# Patient Record
Sex: Male | Born: 1965 | Race: White | Hispanic: No | Marital: Married | Smoking: Never smoker
Health system: Southern US, Community
[De-identification: ages and names within clinical notes are randomized; demographics above are authoritative.]

## PROBLEM LIST (undated history)

## (undated) DIAGNOSIS — R51 Headache: Secondary | ICD-10-CM

## (undated) DIAGNOSIS — N200 Calculus of kidney: Secondary | ICD-10-CM

## (undated) DIAGNOSIS — R112 Nausea with vomiting, unspecified: Secondary | ICD-10-CM

## (undated) DIAGNOSIS — R519 Headache, unspecified: Secondary | ICD-10-CM

## (undated) DIAGNOSIS — Z9889 Other specified postprocedural states: Secondary | ICD-10-CM

## (undated) HISTORY — PX: VARICOCELECTOMY: SHX1084

---

## 2017-02-09 DIAGNOSIS — Z87442 Personal history of urinary calculi: Secondary | ICD-10-CM | POA: Insufficient documentation

## 2017-02-09 DIAGNOSIS — Z8669 Personal history of other diseases of the nervous system and sense organs: Secondary | ICD-10-CM | POA: Insufficient documentation

## 2017-02-09 DIAGNOSIS — Z833 Family history of diabetes mellitus: Secondary | ICD-10-CM | POA: Insufficient documentation

## 2018-07-12 ENCOUNTER — Ambulatory Visit (INDEPENDENT_AMBULATORY_CARE_PROVIDER_SITE_OTHER): Payer: BLUE CROSS/BLUE SHIELD

## 2018-07-12 ENCOUNTER — Encounter: Payer: Self-pay | Admitting: Emergency Medicine

## 2018-07-12 ENCOUNTER — Ambulatory Visit
Admission: EM | Admit: 2018-07-12 | Discharge: 2018-07-12 | Disposition: A | Discharge status: Home / Self Care | Payer: BLUE CROSS/BLUE SHIELD | Attending: Family Medicine | Admitting: Family Medicine

## 2018-07-12 DIAGNOSIS — M545 Low back pain: Secondary | ICD-10-CM | POA: Diagnosis not present

## 2018-07-12 DIAGNOSIS — R1030 Lower abdominal pain, unspecified: Secondary | ICD-10-CM | POA: Diagnosis not present

## 2018-07-12 DIAGNOSIS — N202 Calculus of kidney with calculus of ureter: Secondary | ICD-10-CM | POA: Diagnosis not present

## 2018-07-12 DIAGNOSIS — N200 Calculus of kidney: Secondary | ICD-10-CM

## 2018-07-12 HISTORY — DX: Calculus of kidney: N20.0

## 2018-07-12 LAB — URINALYSIS, COMPLETE (UACMP) WITH MICROSCOPIC
Bacteria, UA: NONE SEEN
Bilirubin Urine: NEGATIVE
Glucose, UA: NEGATIVE mg/dL
Ketones, ur: 40 mg/dL — AB
Leukocytes, UA: NEGATIVE
Nitrite: NEGATIVE
Protein, ur: 30 mg/dL — AB
RBC / HPF: >50 RBC/hpf (ref 0–5)
Specific Gravity, Urine: 1.025 (ref 1.005–1.030)
Squamous Epithelial / LPF: NONE SEEN (ref 0–5)
WBC, UA: NONE SEEN WBC/hpf (ref 0–5)
pH: 6 (ref 5.0–8.0)

## 2018-07-12 MED ORDER — ONDANSETRON 8 MG PO TBDP: 8.0000 mg | ORAL_TABLET | Freq: Two times a day (BID) | ORAL | 0 refills | Status: DC

## 2018-07-12 MED ORDER — ONDANSETRON 8 MG PO TBDP
8.0000 mg | ORAL_TABLET | Freq: Once | ORAL | Status: AC
  Administered 2018-07-12: 8 mg via ORAL

## 2018-07-12 MED ORDER — HYDROCODONE-ACETAMINOPHEN 5-325 MG PO TABS: 1.0000 | ORAL_TABLET | Freq: Four times a day (QID) | ORAL | 0 refills | Status: DC | PRN

## 2018-07-12 MED ORDER — TAMSULOSIN HCL 0.4 MG PO CAPS: 0.4000 mg | ORAL_CAPSULE | Freq: Every morning | ORAL | 0 refills | Status: AC

## 2018-07-12 MED ORDER — KETOROLAC TROMETHAMINE 60 MG/2ML IM SOLN
60.0000 mg | Freq: Once | INTRAMUSCULAR | Status: AC
  Administered 2018-07-12: 60 mg via INTRAMUSCULAR

## 2018-07-12 NOTE — Discharge Instructions (Signed)
Drink plenty of fluids.  Strain all of your urine to capture any stone.  If you worsen go to the emergency room.  Arrange a follow-up with urology next week

## 2018-07-12 NOTE — ED Provider Notes (Signed)
[facility]    CSN: [id number] Arrival date & time: 07/12/18  1538     History   Chief Complaint Chief Complaint  Patient presents with  . Abdominal Pain    HPI Roger Russell is a 52 y.o. male.   HPI  52 year old male presents with his wife complaining of pain on the left lower back with radiation into his left groin.  Dates that the onset was sudden.  Nausea but no vomiting.  He has had one episode of a kidney stone about a year and a half ago.  Recently about a week and a half ago was treated for low back pain from "disks" This pain he states is different.  Does not radiate into his leg.  Very uncomfortable and when I walked in the room he was bent over the examining table.        Past Medical History:  Diagnosis Date  . Kidney stone     There are no active problems to display for this patient.   Past Surgical History:  Procedure Laterality Date  . VARICOCELECTOMY         Home Medications    Prior to Admission medications   Medication Sig Start Date End Date Taking? Authorizing Provider  HYDROcodone-acetaminophen (NORCO/VICODIN) 5-325 MG tablet Take 1-2 tablets by mouth every 6 (six) hours as needed for severe pain. 07/12/18   Lutricia Feil, PA-C  ondansetron (ZOFRAN ODT) 8 MG disintegrating tablet Take 1 tablet (8 mg total) by mouth 2 (two) times daily. 07/12/18   Lutricia Feil, PA-C  tamsulosin (FLOMAX) 0.4 MG CAPS capsule Take 1 capsule (0.4 mg total) by mouth every morning. 07/12/18   Lutricia Feil, PA-C    Family History Family History  Problem Relation Age of Onset  . Hypertension Mother   . Diabetes Mother   . Diabetes Father   . Hypertension Father     Social History Social History   Tobacco Use  . Smoking status: Never Smoker  . Smokeless tobacco: Never Used  Substance Use Topics  . Alcohol use: Yes    Comment: socially  . Drug use: Never     Allergies   Patient has no known allergies.   Review of  Systems Review of Systems  Constitutional: Positive for activity change. Negative for appetite change, chills, fatigue and fever.  Gastrointestinal: Positive for nausea.  All other systems reviewed and are negative.    Physical Exam Triage Vital Signs ED Triage Vitals  Enc Vitals Group     BP 07/12/18 1551 (!) 200/102     Pulse Rate 07/12/18 1551 76     Resp 07/12/18 1551 20     Temp 07/12/18 1551 97.6 F (36.4 C)     Temp Source 07/12/18 1551 Oral     SpO2 07/12/18 1551 100 %     Weight 07/12/18 1549 180 lb (81.6 kg)     Height 07/12/18 1549 6\' 2"  (1.88 m)     Head Circumference --      Peak Flow --      Pain Score 07/12/18 1549 9     Pain Loc --      Pain Edu? --      Excl. in GC? --    No data found.  Updated Vital Signs BP (!) 200/102 (BP Location: Left Arm)   Pulse 76   Temp 97.6 F (36.4 C) (Oral)   Resp 20   Ht 6\' 2"  (1.88 m)  Wt 180 lb (81.6 kg)   SpO2 100%   BMI 23.11 kg/m   Visual Acuity Right Eye Distance:   Left Eye Distance:   Bilateral Distance:    Right Eye Near:   Left Eye Near:    Bilateral Near:     Physical Exam  Constitutional: He is oriented to person, place, and time. He appears well-developed and well-nourished.  Non-toxic appearance. He does not appear ill. No distress.  HENT:  Head: Normocephalic.  Eyes: Pupils are equal, round, and reactive to light.  Pulmonary/Chest: Effort normal and breath sounds normal.  Abdominal: There is CVA tenderness.  Neurological: He is alert and oriented to person, place, and time.  Skin: Skin is warm and dry.  Psychiatric: He has a normal mood and affect. His behavior is normal.  Nursing note and vitals reviewed.    UC Treatments / Results  Labs (all labs ordered are listed, but only abnormal results are displayed) Labs Reviewed  URINALYSIS, COMPLETE (UACMP) WITH MICROSCOPIC - Abnormal; Notable for the following components:      Result Value   APPearance HAZY (*)    Hgb urine dipstick  LARGE (*)    Ketones, ur 40 (*)    Protein, ur 30 (*)    All other components within normal limits    EKG None  Radiology Ct Renal Stone Study  Result Date: 07/12/2018 CLINICAL DATA:  Left flank pain since noon today.  Hematuria. EXAM: CT ABDOMEN AND PELVIS WITHOUT CONTRAST TECHNIQUE: Multidetector CT imaging of the abdomen and pelvis was performed following the standard protocol without IV contrast. COMPARISON:  None. FINDINGS: Lower chest: Calcified granuloma at the left lung base. Hepatobiliary: Small calcified granuloma in the right lobe of the liver. Probable sludge in the gallbladder. Pancreas: Unremarkable. No pancreatic ductal dilatation or surrounding inflammatory changes. Spleen: Normal in size, containing a calcified granuloma. Adrenals/Urinary Tract: 5 mm transverse diameter calculus in the proximal left ureter, just below the ureteropelvic junction. This measures 9 mm in length. Associated mild to moderate dilatation of the left renal collecting system and mild left perinephric soft tissue stranding. There is also a 4 mm calculus at the left ureterovesical junction with mild dilatation of the left ureter. Several small mid and lower pole right renal calculi. The largest measures 5 mm in maximum diameter. No right ureteral calculi. Normal appearing adrenal glands. Stomach/Bowel: Distended stomach containing fluid and gas. The proximal 2nd portion of the duodenum is also distended with fluid. No obstructing mass seen. The remainder of the small bowel is normal in caliber, as is the colon. Normal appearing appendix. Vascular/Lymphatic: Minimal atheromatous aortic calcification. No enlarged lymph nodes. Reproductive: Moderately enlarged prostate gland and seminal vesicles. Other: Tiny umbilical hernia containing fat small left inguinal hernia containing fat. Musculoskeletal: Lower thoracic and minimal lumbar spine degenerative changes. Mild scoliosis. IMPRESSION: 1. 9 mm proximal left ureteral  calculus causing mild to moderate left hydronephrosis. 2. 4 mm distal left ureteral calculus at the ureterovesical junction, causing mild dilatation of the left ureter. 3. Small, nonobstructing right renal calculi. 4. Distended stomach and proximal duodenum, most likely due to reactive ileus. Electronically Signed   By: Beckie SaltsSteven  Reid M.D.   On: 07/12/2018 17:36    Procedures Procedures (including critical care time)  Medications Ordered in UC Medications  ketorolac (TORADOL) injection 60 mg (60 mg Intramuscular Given 07/12/18 1559)  ondansetron (ZOFRAN-ODT) disintegrating tablet 8 mg (8 mg Oral Given 07/12/18 1639)    Initial Impression / Assessment and Plan /  UC Course  I have reviewed the triage vital signs and the nursing notes.  Pertinent labs & imaging results that were available during my care of the patient were reviewed by me and considered in my medical decision making (see chart for details).     Plan: 1. Test/x-ray results and diagnosis reviewed with patient 2. rx as per orders; risks, benefits, potential side effects reviewed with patient 3. Recommend supportive treatment with forced fluids.  Make appointment with urology next week.  Check your in the network of physicians.  You worsen go to the emergency room 4. F/u prn if symptoms worsen or don't improve   Final Clinical Impressions(s) / UC Diagnoses   Final diagnoses:  Kidney stone on left side     Discharge Instructions     Drink plenty of fluids.  Strain all of your urine to capture any stone.  If you worsen go to the emergency room.  Arrange a follow-up with urology next week    ED Prescriptions    Medication Sig Dispense Auth. Provider   ondansetron (ZOFRAN ODT) 8 MG disintegrating tablet Take 1 tablet (8 mg total) by mouth 2 (two) times daily. 6 tablet Ovid Curd P, PA-C   tamsulosin (FLOMAX) 0.4 MG CAPS capsule Take 1 capsule (0.4 mg total) by mouth every morning. 30 capsule Lutricia Feil, PA-C    HYDROcodone-acetaminophen (NORCO/VICODIN) 5-325 MG tablet  (Status: Discontinued) Take 1-2 tablets by mouth every 6 (six) hours as needed for severe pain. 20 tablet Lutricia Feil, PA-C   HYDROcodone-acetaminophen (NORCO/VICODIN) 5-325 MG tablet Take 1-2 tablets by mouth every 6 (six) hours as needed for severe pain. 20 tablet Lutricia Feil, PA-C     Controlled Substance Prescriptions [place] Controlled Substance Registry consulted? Not Applicable   Lutricia Feil, PA-C 07/12/18 1816

## 2018-07-12 NOTE — ED Triage Notes (Signed)
Patient here with c/o abdominal pain that radiates into the left groin area that started 3 hours ago. Reports nausea, denies vomiting.

## 2018-07-15 ENCOUNTER — Encounter: Payer: Self-pay | Admitting: Urology

## 2018-07-15 ENCOUNTER — Other Ambulatory Visit: Payer: Self-pay | Admitting: Radiology

## 2018-07-15 ENCOUNTER — Ambulatory Visit: Payer: BLUE CROSS/BLUE SHIELD | Admitting: Urology

## 2018-07-15 VITALS — BP 170/90 | HR 85 | Ht 74.0 in | Wt 180.0 lb

## 2018-07-15 DIAGNOSIS — N200 Calculus of kidney: Secondary | ICD-10-CM

## 2018-07-15 DIAGNOSIS — N201 Calculus of ureter: Secondary | ICD-10-CM

## 2018-07-15 DIAGNOSIS — N132 Hydronephrosis with renal and ureteral calculous obstruction: Secondary | ICD-10-CM

## 2018-07-15 DIAGNOSIS — R3129 Other microscopic hematuria: Secondary | ICD-10-CM | POA: Diagnosis not present

## 2018-07-15 LAB — MICROSCOPIC EXAMINATION
BACTERIA UA: NONE SEEN
Epithelial Cells (non renal): NONE SEEN /hpf (ref 0–10)
WBC UA: NONE SEEN /HPF (ref 0–5)

## 2018-07-15 LAB — URINALYSIS, COMPLETE
Bilirubin, UA: NEGATIVE
GLUCOSE, UA: NEGATIVE
Ketones, UA: NEGATIVE
Leukocytes, UA: NEGATIVE
Nitrite, UA: NEGATIVE
PH UA: 5.5 (ref 5.0–7.5)
PROTEIN UA: NEGATIVE
Specific Gravity, UA: 1.01 (ref 1.005–1.030)
UUROB: 0.2 mg/dL (ref 0.2–1.0)

## 2018-07-15 MED ORDER — OXYCODONE-ACETAMINOPHEN 10-325 MG PO TABS: 1.0000 | ORAL_TABLET | ORAL | 0 refills | Status: DC | PRN

## 2018-07-15 MED ORDER — ONDANSETRON 8 MG PO TBDP: 8.0000 mg | ORAL_TABLET | Freq: Two times a day (BID) | ORAL | 0 refills | Status: DC

## 2018-07-15 NOTE — H&P (View-Only) (Signed)
07/15/2018 2:05 PM   Roger Russell [birth year] [id number]  Referring provider: Marygrace Drought, MD [address] [facility] [place], [place] [address]  Chief Complaint  Patient presents with  . Nephrolithiasis    HPI: Patient is a 52 year old Caucasian male who was referred by  Dr. Thersa Salt for nephrolithiasis with his wife, Marzetta Board.   He was seen at [facility] urgent care on 07/12/2018 for pain in his left lower back radiating to his left groin associated with nausea.  CT Renal stone study on 07/12/2018 noted a 9 mm proximal left ureteral calculus causing mild to moderate left hydronephrosis.  4 mm distal left ureteral calculus at the ureterovesical junction, causing mild dilatation of the left ureter.   Small, nonobstructing right renal calculi.  Distended stomach and proximal duodenum, most likely due to reactive ileus.  Moderately enlarged prostate gland and seminal vesicles.    He was discharged with Vicodin 5/325, Zofran 8 mg ODT and tamsulosin 0.4 mg.     Labs in the ED:  UA on 07/12/2018 noted > 50 RBC's.     Meds given in the ED: Toradol 60 mg IM and Zofran 8 mg ODT  Prior urological history:  He has a prior stone history.     Current NSAID/anticoagulation:   None   Today: he states the pain is located in the left flank to the left groin.  It was 8/10 last night. Now the pain is 3/10.  He is having associated intermittency, hesitancy and a weak stream.  He is having chills when the pain is intense.  He is having nausea.  Patient denies any gross hematuria or dysuria.   Patient denies any fevers or vomiting.   He has been passing fragments since the Apr 11, 2023.  He has passed a 6 mm x 38m stone on the 21st.    PMH: Past Medical History:  Diagnosis Date  . Kidney stone     Surgical History: Past Surgical History:  Procedure Laterality Date  . VARICOCELECTOMY      Home Medications:  Allergies as of 07/15/2018   No Known Allergies     Medication List          Accurate as of 07/15/18  2:05 PM. Always use your most recent med list.          HYDROcodone-acetaminophen 5-325 MG tablet Commonly known as:  NORCO/VICODIN Take 1-2 tablets by mouth every 6 (six) hours as needed for severe pain.   ondansetron 8 MG disintegrating tablet Commonly known as:  ZOFRAN ODT Take 1 tablet (8 mg total) by mouth 2 (two) times daily.   oxyCODONE-acetaminophen 10-325 MG tablet Commonly known as:  PERCOCET Take 1 tablet by mouth every 4 (four) hours as needed for pain.   tamsulosin 0.4 MG Caps capsule Commonly known as:  FLOMAX Take 1 capsule (0.4 mg total) by mouth every morning.       Allergies: No Known Allergies  Family History: Family History  Problem Relation Age of Onset  . Hypertension Mother   . Diabetes Mother   . Diabetes Father   . Hypertension Father     Social History:  reports that he has never smoked. He has never used smokeless tobacco. He reports that he drinks alcohol. He reports that he does not use drugs.  ROS: UROLOGY Frequent Urination?: No Hard to postpone urination?: No Burning/pain with urination?: No Get up at night to urinate?: No Leakage of urine?: No Urine stream starts and stops?:  Yes Trouble starting stream?: Yes Do you have to strain to urinate?: No Blood in urine?: No Urinary tract infection?: No Sexually transmitted disease?: No Injury to kidneys or bladder?: No Painful intercourse?: No Weak stream?: Yes Erection problems?: No Penile pain?: No  Gastrointestinal Nausea?: Yes Vomiting?: No Indigestion/heartburn?: No Diarrhea?: No Constipation?: Yes  Constitutional Fever: No Night sweats?: Yes Weight loss?: No Fatigue?: Yes  Skin Skin rash/lesions?: No Itching?: No  Eyes Blurred vision?: No Double vision?: No  Ears/Nose/Throat Sore throat?: No Sinus problems?: No  Hematologic/Lymphatic Swollen glands?: No Easy bruising?: No  Cardiovascular Leg swelling?: No Chest pain?:  No  Respiratory Cough?: No Shortness of breath?: No  Endocrine Excessive thirst?: No  Musculoskeletal Back pain?: Yes Joint pain?: No  Neurological Headaches?: No Dizziness?: No  Psychologic Depression?: No Anxiety?: No  Physical Exam: BP (!) 170/90   Pulse 85   Ht _0  (1.88 m)   Wt 180 lb (81.6 kg)   BMI 23.11 kg/m   Constitutional:  Well nourished. Alert and oriented, No acute distress. HEENT: [place] AT, moist mucus membranes.  Trachea midline, no masses. Cardiovascular: No clubbing, cyanosis, or edema. Respiratory: Normal respiratory effort, no increased work of breathing. GI: Abdomen is soft, non tender, non distended, no abdominal masses. Liver and spleen not palpable.  No hernias appreciated.  Stool sample for occult testing is not indicated.   GU: Left CVA tenderness.  No bladder fullness or masses.   Skin: No rashes, bruises or suspicious lesions. Lymph: No cervical or inguinal adenopathy. Neurologic: Grossly intact, no focal deficits, moving all 4 extremities. Psychiatric: Normal mood and affect.  Laboratory Data: No results found for: WBC, HGB, HCT, MCV, PLT  No results found for: CREATININE  No results found for: PSA  No results found for: TESTOSTERONE  No results found for: HGBA1C  No results found for: TSH  No results found for: CHOL, HDL, CHOLHDL, VLDL, LDLCALC  No results found for: AST No results found for: ALT No components found for: ALKALINEPHOPHATASE No components found for: BILIRUBINTOTAL  No results found for: ESTRADIOL  Urinalysis See HPI and Epic  I have reviewed the labs.   Pertinent Imaging: CLINICAL DATA:  Left flank pain since noon today.  Hematuria.  EXAM: CT ABDOMEN AND PELVIS WITHOUT CONTRAST  TECHNIQUE: Multidetector CT imaging of the abdomen and pelvis was performed following the standard protocol without IV contrast.  COMPARISON:  None.  FINDINGS: Lower chest: Calcified granuloma at the left lung  base.  Hepatobiliary: Small calcified granuloma in the right lobe of the liver. Probable sludge in the gallbladder.  Pancreas: Unremarkable. No pancreatic ductal dilatation or surrounding inflammatory changes.  Spleen: Normal in size, containing a calcified granuloma.  Adrenals/Urinary Tract: 5 mm transverse diameter calculus in the proximal left ureter, just below the ureteropelvic junction. This measures 9 mm in length. Associated mild to moderate dilatation of the left renal collecting system and mild left perinephric soft tissue stranding.  There is also a 4 mm calculus at the left ureterovesical junction with mild dilatation of the left ureter.  Several small mid and lower pole right renal calculi. The largest measures 5 mm in maximum diameter. No right ureteral calculi.  Normal appearing adrenal glands.  Stomach/Bowel: Distended stomach containing fluid and gas. The proximal 2nd portion of the duodenum is also distended with fluid. No obstructing mass seen. The remainder of the small bowel is normal in caliber, as is the colon. Normal appearing appendix.  Vascular/Lymphatic: Minimal atheromatous aortic calcification.  No enlarged lymph nodes.  Reproductive: Moderately enlarged prostate gland and seminal vesicles.  Other: Tiny umbilical hernia containing fat small left inguinal hernia containing fat.  Musculoskeletal: Lower thoracic and minimal lumbar spine degenerative changes. Mild scoliosis.  IMPRESSION: 1. 9 mm proximal left ureteral calculus causing mild to moderate left hydronephrosis. 2. 4 mm distal left ureteral calculus at the ureterovesical junction, causing mild dilatation of the left ureter. 3. Small, nonobstructing right renal calculi. 4. Distended stomach and proximal duodenum, most likely due to reactive ileus.   Electronically Signed   By: Claudie Revering M.D.   On: 07/12/2018 17:36  I have independently reviewed the films.     Assessment & Plan:    1. Left ureteral stone Stone fragments sent for analysis Explained to the patient that AUA Guidelines for patients with uncomplicated ureteral stones ?10 mm should be offered observation, and those with distal stones of similar size should be offered MET with ?-blockers - he wants to undergo more definitive treatment at this time Explained that URS would be the first lined therapy if stone(s) do not pass, but ESWL is the procedure with the least morbidity and lowest complication rate, but URS has a greater stone-free rate in a single procedure He would like to have left ESWL I explained that ESWL is a means of pulverizing urinary stones without surgery using shockwave therapy I discussed the risks involved with ESWL consist of bruising to the skin and kidney region as a result of the shockwave, possibility of long-term kidney damage, development of high blood pressure and damage to the bowel or long, hematuria, urinary bleeding serious enough to require transfusion or surgical repair or removal of the kidney is rare, rare chance of hematoma formation in the kidney and injuries to the spleen, liver or pancreas.  There is also the risk of urinary tract infection or any infection of the blood system or tissue.   There is also a possibility of resultant damage to male organs. I explained that sometimes the stone fragments can stack up in the ureter like coins, a phenomenon described as "Steinstrasse", that would result in a stent placement and/or URS for further treatment I informed the patient that IV sedation is typically used on the truck, but it some rare instances we need to use general anesthesia - the risks being infection, irregular heart beat, irregular BP, stroke, MI, CVA, paralysis, coma and/or death. Advised to contact our office or seek treatment in the ED if becomes febrile or pain/ vomiting are difficult control in order to arrange for emergent/urgent  intervention Script for Percocet 10/325 given # 10, advised not to take more than directed as it has an addictive potential and a possibility of respiratory depression, he should not drive taking this medication or make important decisions while on this medication Patient last received script for Vicodin on 07/12/2018 Refill given for Zofran 8 mg ODT   2. Left hydronephrosis )btain RUS to ensure the hydronephrosis has resolved once they have passed and/or recovered from procedure to ensure to iatrogenic hydronephrosis remains - it is explained to the patient that it is important to document resolution of the hydronephrosis as "silent hydronephrosis" can occur and cause damage and/or loss of the kidney  3. Microscopic hematuria UA today demonstrates 11-30 RBC's continue to monitor the patient's UA after the treatment/passage of the stone to ensure the hematuria has resolved if hematuria persists, we will pursue a hematuria workup with CT Urogram and cystoscopy if appropriate.  4.  Right renal stones Will continue to monitor at this time Will need at least yearly KUB's Advised to contact our office or seek treatment in the ED if becomes febrile or pain/ vomiting are difficult control in order to arrange for emergent/urgent intervention  Return for left ESWL.  These notes generated with voice recognition software. I apologize for typographical errors.  Zara Council, PA-C  [facility] [address]  [place], [place] [address] [phone]

## 2018-07-15 NOTE — Progress Notes (Signed)
07/15/2018 2:05 PM   Sandre Kitty 12/20/1966 [id number]  Referring provider: Marygrace Drought, MD [address] [facility] [place], [place] [address]  Chief Complaint  Patient presents with  . Nephrolithiasis    HPI: Patient is a 52 year old Caucasian male who was referred by  Dr. Thersa Salt for nephrolithiasis with his wife, Marzetta Board.   He was seen at [facility] urgent care on 07/12/2018 for pain in his left lower back radiating to his left groin associated with nausea.  CT Renal stone study on 07/12/2018 noted a 9 mm proximal left ureteral calculus causing mild to moderate left hydronephrosis.  4 mm distal left ureteral calculus at the ureterovesical junction, causing mild dilatation of the left ureter.   Small, nonobstructing right renal calculi.  Distended stomach and proximal duodenum, most likely due to reactive ileus.  Moderately enlarged prostate gland and seminal vesicles.    He was discharged with Vicodin 5/325, Zofran 8 mg ODT and tamsulosin 0.4 mg.     Labs in the ED:  UA on 07/12/2018 noted > 50 RBC's.     Meds given in the ED: Toradol 60 mg IM and Zofran 8 mg ODT  Prior urological history:  He has a prior stone history.     Current NSAID/anticoagulation:   None   Today: he states the pain is located in the left flank to the left groin.  It was 8/10 last night. Now the pain is 3/10.  He is having associated intermittency, hesitancy and a weak stream.  He is having chills when the pain is intense.  He is having nausea.  Patient denies any gross hematuria or dysuria.   Patient denies any fevers or vomiting.   He has been passing fragments since the 03/22/2023.  He has passed a 6 mm x 68m stone on the 21st.    PMH: Past Medical History:  Diagnosis Date  . Kidney stone     Surgical History: Past Surgical History:  Procedure Laterality Date  . VARICOCELECTOMY      Home Medications:  Allergies as of 07/15/2018   No Known Allergies     Medication List          Accurate as of 07/15/18  2:05 PM. Always use your most recent med list.          HYDROcodone-acetaminophen 5-325 MG tablet Commonly known as:  NORCO/VICODIN Take 1-2 tablets by mouth every 6 (six) hours as needed for severe pain.   ondansetron 8 MG disintegrating tablet Commonly known as:  ZOFRAN ODT Take 1 tablet (8 mg total) by mouth 2 (two) times daily.   oxyCODONE-acetaminophen 10-325 MG tablet Commonly known as:  PERCOCET Take 1 tablet by mouth every 4 (four) hours as needed for pain.   tamsulosin 0.4 MG Caps capsule Commonly known as:  FLOMAX Take 1 capsule (0.4 mg total) by mouth every morning.       Allergies: No Known Allergies  Family History: Family History  Problem Relation Age of Onset  . Hypertension Mother   . Diabetes Mother   . Diabetes Father   . Hypertension Father     Social History:  reports that he has never smoked. He has never used smokeless tobacco. He reports that he drinks alcohol. He reports that he does not use drugs.  ROS: UROLOGY Frequent Urination?: No Hard to postpone urination?: No Burning/pain with urination?: No Get up at night to urinate?: No Leakage of urine?: No Urine stream starts and stops?:  Yes Trouble starting stream?: Yes Do you have to strain to urinate?: No Blood in urine?: No Urinary tract infection?: No Sexually transmitted disease?: No Injury to kidneys or bladder?: No Painful intercourse?: No Weak stream?: Yes Erection problems?: No Penile pain?: No  Gastrointestinal Nausea?: Yes Vomiting?: No Indigestion/heartburn?: No Diarrhea?: No Constipation?: Yes  Constitutional Fever: No Night sweats?: Yes Weight loss?: No Fatigue?: Yes  Skin Skin rash/lesions?: No Itching?: No  Eyes Blurred vision?: No Double vision?: No  Ears/Nose/Throat Sore throat?: No Sinus problems?: No  Hematologic/Lymphatic Swollen glands?: No Easy bruising?: No  Cardiovascular Leg swelling?: No Chest pain?:  No  Respiratory Cough?: No Shortness of breath?: No  Endocrine Excessive thirst?: No  Musculoskeletal Back pain?: Yes Joint pain?: No  Neurological Headaches?: No Dizziness?: No  Psychologic Depression?: No Anxiety?: No  Physical Exam: BP (!) 170/90   Pulse 85   Ht _0  (1.88 m)   Wt 180 lb (81.6 kg)   BMI 23.11 kg/m   Constitutional:  Well nourished. Alert and oriented, No acute distress. HEENT: [place] AT, moist mucus membranes.  Trachea midline, no masses. Cardiovascular: No clubbing, cyanosis, or edema. Respiratory: Normal respiratory effort, no increased work of breathing. GI: Abdomen is soft, non tender, non distended, no abdominal masses. Liver and spleen not palpable.  No hernias appreciated.  Stool sample for occult testing is not indicated.   GU: Left CVA tenderness.  No bladder fullness or masses.   Skin: No rashes, bruises or suspicious lesions. Lymph: No cervical or inguinal adenopathy. Neurologic: Grossly intact, no focal deficits, moving all 4 extremities. Psychiatric: Normal mood and affect.  Laboratory Data: No results found for: WBC, HGB, HCT, MCV, PLT  No results found for: CREATININE  No results found for: PSA  No results found for: TESTOSTERONE  No results found for: HGBA1C  No results found for: TSH  No results found for: CHOL, HDL, CHOLHDL, VLDL, LDLCALC  No results found for: AST No results found for: ALT No components found for: ALKALINEPHOPHATASE No components found for: BILIRUBINTOTAL  No results found for: ESTRADIOL  Urinalysis See HPI and Epic  I have reviewed the labs.   Pertinent Imaging: CLINICAL DATA:  Left flank pain since noon today.  Hematuria.  EXAM: CT ABDOMEN AND PELVIS WITHOUT CONTRAST  TECHNIQUE: Multidetector CT imaging of the abdomen and pelvis was performed following the standard protocol without IV contrast.  COMPARISON:  None.  FINDINGS: Lower chest: Calcified granuloma at the left lung  base.  Hepatobiliary: Small calcified granuloma in the right lobe of the liver. Probable sludge in the gallbladder.  Pancreas: Unremarkable. No pancreatic ductal dilatation or surrounding inflammatory changes.  Spleen: Normal in size, containing a calcified granuloma.  Adrenals/Urinary Tract: 5 mm transverse diameter calculus in the proximal left ureter, just below the ureteropelvic junction. This measures 9 mm in length. Associated mild to moderate dilatation of the left renal collecting system and mild left perinephric soft tissue stranding.  There is also a 4 mm calculus at the left ureterovesical junction with mild dilatation of the left ureter.  Several small mid and lower pole right renal calculi. The largest measures 5 mm in maximum diameter. No right ureteral calculi.  Normal appearing adrenal glands.  Stomach/Bowel: Distended stomach containing fluid and gas. The proximal 2nd portion of the duodenum is also distended with fluid. No obstructing mass seen. The remainder of the small bowel is normal in caliber, as is the colon. Normal appearing appendix.  Vascular/Lymphatic: Minimal atheromatous aortic calcification.  No enlarged lymph nodes.  Reproductive: Moderately enlarged prostate gland and seminal vesicles.  Other: Tiny umbilical hernia containing fat small left inguinal hernia containing fat.  Musculoskeletal: Lower thoracic and minimal lumbar spine degenerative changes. Mild scoliosis.  IMPRESSION: 1. 9 mm proximal left ureteral calculus causing mild to moderate left hydronephrosis. 2. 4 mm distal left ureteral calculus at the ureterovesical junction, causing mild dilatation of the left ureter. 3. Small, nonobstructing right renal calculi. 4. Distended stomach and proximal duodenum, most likely due to reactive ileus.   Electronically Signed   By: Claudie Revering M.D.   On: 07/12/2018 17:36  I have independently reviewed the films.     Assessment & Plan:    1. Left ureteral stone Stone fragments sent for analysis Explained to the patient that AUA Guidelines for patients with uncomplicated ureteral stones ?10 mm should be offered observation, and those with distal stones of similar size should be offered MET with ?-blockers - he wants to undergo more definitive treatment at this time Explained that URS would be the first lined therapy if stone(s) do not pass, but ESWL is the procedure with the least morbidity and lowest complication rate, but URS has a greater stone-free rate in a single procedure He would like to have left ESWL I explained that ESWL is a means of pulverizing urinary stones without surgery using shockwave therapy I discussed the risks involved with ESWL consist of bruising to the skin and kidney region as a result of the shockwave, possibility of long-term kidney damage, development of high blood pressure and damage to the bowel or long, hematuria, urinary bleeding serious enough to require transfusion or surgical repair or removal of the kidney is rare, rare chance of hematoma formation in the kidney and injuries to the spleen, liver or pancreas.  There is also the risk of urinary tract infection or any infection of the blood system or tissue.   There is also a possibility of resultant damage to male organs. I explained that sometimes the stone fragments can stack up in the ureter like coins, a phenomenon described as "Steinstrasse", that would result in a stent placement and/or URS for further treatment I informed the patient that IV sedation is typically used on the truck, but it some rare instances we need to use general anesthesia - the risks being infection, irregular heart beat, irregular BP, stroke, MI, CVA, paralysis, coma and/or death. Advised to contact our office or seek treatment in the ED if becomes febrile or pain/ vomiting are difficult control in order to arrange for emergent/urgent  intervention Script for Percocet 10/325 given # 10, advised not to take more than directed as it has an addictive potential and a possibility of respiratory depression, he should not drive taking this medication or make important decisions while on this medication Patient last received script for Vicodin on 07/12/2018 Refill given for Zofran 8 mg ODT   2. Left hydronephrosis )btain RUS to ensure the hydronephrosis has resolved once they have passed and/or recovered from procedure to ensure to iatrogenic hydronephrosis remains - it is explained to the patient that it is important to document resolution of the hydronephrosis as "silent hydronephrosis" can occur and cause damage and/or loss of the kidney  3. Microscopic hematuria UA today demonstrates 11-30 RBC's continue to monitor the patient's UA after the treatment/passage of the stone to ensure the hematuria has resolved if hematuria persists, we will pursue a hematuria workup with CT Urogram and cystoscopy if appropriate.  4.  Right renal stones Will continue to monitor at this time Will need at least yearly KUB's Advised to contact our office or seek treatment in the ED if becomes febrile or pain/ vomiting are difficult control in order to arrange for emergent/urgent intervention  Return for left ESWL.  These notes generated with voice recognition software. I apologize for typographical errors.  Zara Council, PA-C  [facility] [address]  [place], [place] [address] [phone]

## 2018-07-16 ENCOUNTER — Other Ambulatory Visit: Payer: Self-pay | Admitting: Radiology

## 2018-07-16 LAB — BASIC METABOLIC PANEL
BUN / CREAT RATIO: 10 (ref 9–20)
BUN: 18 mg/dL (ref 6–24)
CHLORIDE: 98 mmol/L (ref 96–106)
CO2: 25 mmol/L (ref 20–29)
Calcium: 9.3 mg/dL (ref 8.7–10.2)
Creatinine, Ser: 1.74 mg/dL — ABNORMAL HIGH (ref 0.76–1.27)
GFR calc Af Amer: 52 mL/min/{1.73_m2} — ABNORMAL LOW (ref >59)
GFR calc non Af Amer: 45 mL/min/{1.73_m2} — ABNORMAL LOW (ref >59)
GLUCOSE: 112 mg/dL — AB (ref 65–99)
Potassium: 4.8 mmol/L (ref 3.5–5.2)
SODIUM: 139 mmol/L (ref 134–144)

## 2018-07-16 LAB — CBC WITH DIFFERENTIAL/PLATELET
BASOS: 0 %
Basophils Absolute: 0 10*3/uL (ref 0.0–0.2)
EOS (ABSOLUTE): 0 10*3/uL (ref 0.0–0.4)
Eos: 0 %
HEMOGLOBIN: 14.2 g/dL (ref 13.0–17.7)
Hematocrit: 43.3 % (ref 37.5–51.0)
IMMATURE GRANS (ABS): 0 10*3/uL (ref 0.0–0.1)
Immature Granulocytes: 0 %
Lymphocytes Absolute: 1.2 10*3/uL (ref 0.7–3.1)
Lymphs: 10 %
MCH: 29 pg (ref 26.6–33.0)
MCHC: 32.8 g/dL (ref 31.5–35.7)
MCV: 88 fL (ref 79–97)
MONOCYTES: 12 %
Monocytes Absolute: 1.5 10*3/uL — ABNORMAL HIGH (ref 0.1–0.9)
NEUTROS ABS: 9.8 10*3/uL — AB (ref 1.4–7.0)
NEUTROS PCT: 78 %
Platelets: 211 10*3/uL (ref 150–450)
RBC: 4.9 x10E6/uL (ref 4.14–5.80)
RDW: 13.3 % (ref 12.3–15.4)
WBC: 12.6 10*3/uL — AB (ref 3.4–10.8)

## 2018-07-17 MED ORDER — CIPROFLOXACIN HCL 500 MG PO TABS
500.0000 mg | ORAL_TABLET | ORAL | Status: AC
  Administered 2018-07-18: 500 mg via ORAL

## 2018-07-18 ENCOUNTER — Other Ambulatory Visit: Payer: Self-pay

## 2018-07-18 ENCOUNTER — Ambulatory Visit: Payer: BLUE CROSS/BLUE SHIELD

## 2018-07-18 ENCOUNTER — Ambulatory Visit
Admission: RE | Admit: 2018-07-18 | Discharge: 2018-07-18 | Disposition: A | Discharge status: Home / Self Care | Payer: BLUE CROSS/BLUE SHIELD | Source: Ambulatory Visit | Attending: Urology | Admitting: Urology

## 2018-07-18 ENCOUNTER — Telehealth: Payer: Self-pay | Admitting: Urology

## 2018-07-18 ENCOUNTER — Encounter
Admission: RE | Disposition: A | Discharge status: Home / Self Care | Payer: Self-pay | Source: Ambulatory Visit | Attending: Urology

## 2018-07-18 DIAGNOSIS — Z833 Family history of diabetes mellitus: Secondary | ICD-10-CM | POA: Diagnosis not present

## 2018-07-18 DIAGNOSIS — Z87442 Personal history of urinary calculi: Secondary | ICD-10-CM | POA: Diagnosis not present

## 2018-07-18 DIAGNOSIS — N201 Calculus of ureter: Secondary | ICD-10-CM

## 2018-07-18 DIAGNOSIS — N132 Hydronephrosis with renal and ureteral calculous obstruction: Secondary | ICD-10-CM | POA: Diagnosis present

## 2018-07-18 DIAGNOSIS — Z8249 Family history of ischemic heart disease and other diseases of the circulatory system: Secondary | ICD-10-CM | POA: Diagnosis not present

## 2018-07-18 HISTORY — DX: Other specified postprocedural states: R11.2

## 2018-07-18 HISTORY — DX: Headache: R51

## 2018-07-18 HISTORY — PX: EXTRACORPOREAL SHOCK WAVE LITHOTRIPSY: SHX1557

## 2018-07-18 HISTORY — DX: Other specified postprocedural states: Z98.890

## 2018-07-18 HISTORY — DX: Headache, unspecified: R51.9

## 2018-07-18 SURGERY — LITHOTRIPSY, ESWL
Anesthesia: Moderate Sedation | Laterality: Left

## 2018-07-18 MED ORDER — DIAZEPAM 5 MG PO TABS
ORAL_TABLET | ORAL | Status: AC
  Administered 2018-07-18: 10 mg via ORAL
  Filled 2018-07-18: qty 2

## 2018-07-18 MED ORDER — KETOROLAC TROMETHAMINE 30 MG/ML IJ SOLN
INTRAMUSCULAR | Status: AC
  Administered 2018-07-18: 30 mg
  Filled 2018-07-18: qty 1

## 2018-07-18 MED ORDER — DIPHENHYDRAMINE HCL 25 MG PO CAPS
25.0000 mg | ORAL_CAPSULE | ORAL | Status: AC
  Administered 2018-07-18: 25 mg via ORAL

## 2018-07-18 MED ORDER — CIPROFLOXACIN HCL 500 MG PO TABS
ORAL_TABLET | ORAL | Status: AC
  Administered 2018-07-18: 500 mg via ORAL
  Filled 2018-07-18: qty 1

## 2018-07-18 MED ORDER — DIAZEPAM 5 MG PO TABS
10.0000 mg | ORAL_TABLET | ORAL | Status: AC
  Administered 2018-07-18: 10 mg via ORAL

## 2018-07-18 MED ORDER — HYDROCODONE-ACETAMINOPHEN 5-325 MG PO TABS: 1.0000 | ORAL_TABLET | Freq: Four times a day (QID) | ORAL | 0 refills | Status: DC | PRN

## 2018-07-18 MED ORDER — ONDANSETRON HCL 4 MG/2ML IJ SOLN
4.0000 mg | Freq: Once | INTRAMUSCULAR | Status: AC | PRN
  Administered 2018-07-18: 4 mg via INTRAVENOUS

## 2018-07-18 MED ORDER — SODIUM CHLORIDE 0.9 % IV SOLN
INTRAVENOUS | Status: DC
  Administered 2018-07-18: 11:00:00 via INTRAVENOUS

## 2018-07-18 MED ORDER — DIPHENHYDRAMINE HCL 25 MG PO CAPS
ORAL_CAPSULE | ORAL | Status: AC
  Administered 2018-07-18: 25 mg via ORAL
  Filled 2018-07-18: qty 1

## 2018-07-18 MED ORDER — ONDANSETRON HCL 4 MG/2ML IJ SOLN
INTRAMUSCULAR | Status: AC
  Administered 2018-07-18: 4 mg via INTRAVENOUS
  Filled 2018-07-18: qty 2

## 2018-07-18 NOTE — Telephone Encounter (Signed)
Unable to visualize stone today on lithotripsy tract due to extensive bowel gas.  Please give the patient a call tomorrow and discuss his options with him.  We can try to get him back on the truck next week with a full bottle of magnesium citrate bowel prep the day before the procedure to rid him of his bowel gas with a second attempt on the truck.  Alternatively, he could be booked for ureteroscopy, laser lithotripsy with ureteral stent placement.  Let me know what he decides and I will fill out the appropriate booking sheet.  I will discuss this with his family member today as well.  Vanna ScotlandAshley Zoriyah Scheidegger, MD

## 2018-07-18 NOTE — Interval H&P Note (Signed)
History and Physical Interval Note:  07/18/2018 1:05 PM  Roger CongressKenneth Russell  has presented today for surgery, with the diagnosis of Kidney Stone  The various methods of treatment have been discussed with the patient and family. After consideration of risks, benefits and other options for treatment, the patient has consented to  Procedure(s): EXTRACORPOREAL SHOCK WAVE LITHOTRIPSY (ESWL) (Left) as a surgical intervention .  The patient's history has been reviewed, patient examined, no change in status, stable for surgery.  I have reviewed the patient's chart and labs.  Questions were answered to the patient's satisfaction.     Vanna ScotlandAshley Jamesha Ellsworth

## 2018-07-18 NOTE — Interval H&P Note (Signed)
History and Physical Interval Note:  07/18/2018 12:19 PM  [name]  has presented today for surgery, with the diagnosis of Kidney Stone  The various methods of treatment have been discussed with the patient and family. After consideration of risks, benefits and other options for treatment, the patient has consented to  Procedure(s): EXTRACORPOREAL SHOCK WAVE LITHOTRIPSY (ESWL) (Left) as a surgical intervention .  The patient's history has been reviewed, patient examined, no change in status, stable for surgery.  I have reviewed the patient's chart and labs.  Questions were answered to the patient's satisfaction.     Vanna ScotlandAshley Oluwafemi Villella

## 2018-07-18 NOTE — Progress Notes (Signed)
AMbulatory care instructions for dc given only as pts procedure cancelled

## 2018-07-18 NOTE — Discharge Instructions (Signed)
AMBULATORY SURGERY  °DISCHARGE INSTRUCTIONS ° ° °1) The drugs that you were given will stay in your system until tomorrow so for the next 24 hours you should not: ° °A) Drive an automobile °B) Make any legal decisions °C) Drink any alcoholic beverage ° ° °2) You may resume regular meals tomorrow.  Today it is better to start with liquids and gradually work up to solid foods. ° °You may eat anything you prefer, but it is better to start with liquids, then soup and crackers, and gradually work up to solid foods. ° ° °3) Please notify your doctor immediately if you have any unusual bleeding, trouble breathing, redness and pain at the surgery site, drainage, fever, or pain not relieved by medication. ° ° ° °4) Additional Instructions: ° ° ° ° ° ° ° °Please contact your physician with any problems or Same Day Surgery at [phone], Monday through Friday 6 am to 4 pm, or [place] [facility] [place] Main number at [phone].AMBULATORY SURGERY  °DISCHARGE INSTRUCTIONS ° ° °5) The drugs that you were given will stay in your system until tomorrow so for the next 24 hours you should not: ° °D) Drive an automobile °E) Make any legal decisions °F) Drink any alcoholic beverage ° ° °6) You may resume regular meals tomorrow.  Today it is better to start with liquids and gradually work up to solid foods. ° °You may eat anything you prefer, but it is better to start with liquids, then soup and crackers, and gradually work up to solid foods. ° ° °7) Please notify your doctor immediately if you have any unusual bleeding, trouble breathing, redness and pain at the surgery site, drainage, fever, or pain not relieved by medication. ° ° ° °8) Additional Instructions: ° ° ° ° ° ° ° °Please contact your physician with any problems or Same Day Surgery at [phone], Monday through Friday 6 am to 4 pm, or [place] [facility] [place] Main number at [phone].AMBULATORY SURGERY  °DISCHARGE INSTRUCTIONS ° ° °9) The drugs that you were given  will stay in your system until tomorrow so for the next 24 hours you should not: ° °G) Drive an automobile °H) Make any legal decisions °I) Drink any alcoholic beverage ° ° °10) You may resume regular meals tomorrow.  Today it is better to start with liquids and gradually work up to solid foods. ° °You may eat anything you prefer, but it is better to start with liquids, then soup and crackers, and gradually work up to solid foods. ° ° °11) Please notify your doctor immediately if you have any unusual bleeding, trouble breathing, redness and pain at the surgery site, drainage, fever, or pain not relieved by medication. ° ° ° °12) Additional Instructions: ° ° ° ° ° ° ° °Please contact your physician with any problems or Same Day Surgery at [phone], Monday through Friday 6 am to 4 pm, or [place] at  Main number at [phone].See [facility] discharge instructions in chart. ° °

## 2018-07-19 ENCOUNTER — Other Ambulatory Visit: Payer: Self-pay | Admitting: Radiology

## 2018-07-19 ENCOUNTER — Encounter: Payer: Self-pay | Admitting: Urology

## 2018-07-19 DIAGNOSIS — N201 Calculus of ureter: Secondary | ICD-10-CM

## 2018-07-19 NOTE — Telephone Encounter (Signed)
Patient would like to proceed with ESWL with Dr Lonna CobbStoioff on 07/25/2018. Instructed patient to drink a full bottle of magnesium citrate as well as use a fleets enema prior to surgery. Questions answered. Patient voices understanding.

## 2018-07-20 LAB — CULTURE, URINE COMPREHENSIVE

## 2018-07-22 ENCOUNTER — Telehealth: Payer: Self-pay | Admitting: Radiology

## 2018-07-22 ENCOUNTER — Other Ambulatory Visit: Payer: Self-pay | Admitting: Radiology

## 2018-07-22 DIAGNOSIS — N390 Urinary tract infection, site not specified: Secondary | ICD-10-CM

## 2018-07-22 DIAGNOSIS — R319 Hematuria, unspecified: Principal | ICD-10-CM

## 2018-07-22 MED ORDER — CIPROFLOXACIN HCL 500 MG PO TABS: 500.0000 mg | ORAL_TABLET | Freq: Two times a day (BID) | ORAL | 0 refills | Status: AC

## 2018-07-22 NOTE — Telephone Encounter (Signed)
-----   Message from Harle BattiestShannon A McGowan, PA-C sent at 07/20/2018  3:08 PM EDT ----- Please let Mr. Fayrene FearingJames know his urine culture is positive and he needs to start Cipro 500 mg bid for seven days.  He is scheduled for ESWL so he needs to start his antibiotic by Monday.

## 2018-07-22 NOTE — Telephone Encounter (Signed)
Informed patient of positive urine culture & script to be sent to pharmacy. Patient states [facility]/2018 due to pain & urine culture was performed that was negative for infection. This was confirmed in Care Everywhere. Patient states he had touched the inside of the specimen container when doing the urine sample here on 07/15/2018. Advised patient that we will plan to proceed with ESWL on 07/25/2018. Patient voices understanding.

## 2018-07-22 NOTE — Telephone Encounter (Signed)
-----   Message from Shannon A McGowan, PA-C sent at 07/20/2018  3:08 PM EDT ----- Please let Mr. Ledyard know his urine culture is positive and he needs to start Cipro 500 mg bid for seven days.  He is scheduled for ESWL so he needs to start his antibiotic by Monday. 

## 2018-07-22 NOTE — Telephone Encounter (Signed)
Amy already informed pt of results.

## 2018-07-24 ENCOUNTER — Telehealth: Payer: Self-pay | Admitting: Radiology

## 2018-07-24 ENCOUNTER — Other Ambulatory Visit: Payer: Self-pay | Admitting: Radiology

## 2018-07-24 DIAGNOSIS — N201 Calculus of ureter: Secondary | ICD-10-CM

## 2018-07-24 NOTE — Telephone Encounter (Signed)
Patient states he passed the large stone but asks if he can still have shockwave lithotripsy for the renal stones. Per Michiel CowboyShannon McGowan, informed patient this is not recommended due to ureteral stones being on the left & renal stones on the right. Informed patient that procedure scheduled 07/25/2018 will be cancelled & follow up changed to 08/30/2018 with a renal ultrasound prior. Questions answered. Patient voices understanding.

## 2018-07-25 ENCOUNTER — Encounter: Admission: RE | Payer: Self-pay | Source: Ambulatory Visit

## 2018-07-25 ENCOUNTER — Ambulatory Visit: Admission: RE | Admit: 2018-07-25 | Payer: BLUE CROSS/BLUE SHIELD | Source: Ambulatory Visit | Admitting: Urology

## 2018-07-25 SURGERY — LITHOTRIPSY, ESWL
Anesthesia: Moderate Sedation | Laterality: Left

## 2018-07-29 ENCOUNTER — Other Ambulatory Visit: Payer: Self-pay | Admitting: Urology

## 2018-08-01 ENCOUNTER — Ambulatory Visit: Payer: BLUE CROSS/BLUE SHIELD | Admitting: Urology

## 2018-08-27 ENCOUNTER — Ambulatory Visit: Payer: BLUE CROSS/BLUE SHIELD | Admitting: Urology

## 2018-08-28 ENCOUNTER — Ambulatory Visit
Admission: RE | Admit: 2018-08-28 | Discharge: 2018-08-28 | Disposition: A | Discharge status: Home / Self Care | Payer: BLUE CROSS/BLUE SHIELD | Source: Ambulatory Visit | Attending: Urology | Admitting: Urology

## 2018-08-28 ENCOUNTER — Encounter (INDEPENDENT_AMBULATORY_CARE_PROVIDER_SITE_OTHER): Payer: Self-pay

## 2018-08-28 DIAGNOSIS — N200 Calculus of kidney: Secondary | ICD-10-CM | POA: Diagnosis not present

## 2018-08-28 DIAGNOSIS — N201 Calculus of ureter: Secondary | ICD-10-CM

## 2018-08-30 ENCOUNTER — Ambulatory Visit (INDEPENDENT_AMBULATORY_CARE_PROVIDER_SITE_OTHER): Payer: BLUE CROSS/BLUE SHIELD | Admitting: Urology

## 2018-08-30 ENCOUNTER — Encounter: Payer: Self-pay | Admitting: Urology

## 2018-08-30 ENCOUNTER — Other Ambulatory Visit
Admission: RE | Admit: 2018-08-30 | Discharge: 2018-08-30 | Disposition: A | Discharge status: Home / Self Care | Payer: BLUE CROSS/BLUE SHIELD | Source: Ambulatory Visit | Attending: Urology | Admitting: Urology

## 2018-08-30 VITALS — BP 146/96 | HR 98 | Ht 74.0 in | Wt 187.0 lb

## 2018-08-30 DIAGNOSIS — N132 Hydronephrosis with renal and ureteral calculous obstruction: Secondary | ICD-10-CM | POA: Diagnosis not present

## 2018-08-30 DIAGNOSIS — N200 Calculus of kidney: Secondary | ICD-10-CM

## 2018-08-30 DIAGNOSIS — R3129 Other microscopic hematuria: Secondary | ICD-10-CM | POA: Diagnosis present

## 2018-08-30 DIAGNOSIS — N201 Calculus of ureter: Secondary | ICD-10-CM | POA: Diagnosis not present

## 2018-08-30 LAB — URINALYSIS, COMPLETE (UACMP) WITH MICROSCOPIC
BACTERIA UA: NONE SEEN
BILIRUBIN URINE: NEGATIVE
GLUCOSE, UA: NEGATIVE mg/dL
Hgb urine dipstick: NEGATIVE
Ketones, ur: NEGATIVE mg/dL
LEUKOCYTES UA: NEGATIVE
Nitrite: NEGATIVE
PH: 5 (ref 5.0–8.0)
Protein, ur: NEGATIVE mg/dL
SPECIFIC GRAVITY, URINE: 1.02 (ref 1.005–1.030)
Squamous Epithelial / LPF: NONE SEEN (ref 0–5)
WBC, UA: NONE SEEN WBC/hpf (ref 0–5)

## 2018-08-30 LAB — CREATININE, SERUM
Creatinine, Ser: 0.94 mg/dL (ref 0.61–1.24)
GFR calc non Af Amer: >60 mL/min (ref >60)

## 2018-08-30 LAB — PSA: PROSTATIC SPECIFIC ANTIGEN: 1.09 ng/mL (ref 0.00–4.00)

## 2018-08-30 NOTE — Progress Notes (Signed)
08/30/2018 10:38 AM   Roger Russell Feb 19, 1966 [id number]  Referring provider: Sharilyn Sites, MD [address] [facility] [place], [place] [address]  Chief Complaint  Patient presents with  . Nephrolithiasis    HPI: Patient is a 52 year old Caucasian male with a history of nephrolithiasis who presents today to review his RUS.    CT Renal stone study on 19-Jul-2018 noted a 9 mm proximal left ureteral calculus causing mild to moderate left hydronephrosis.  4 mm distal left ureteral calculus at the ureterovesical junction, causing mild dilatation of the left ureter.   Small, nonobstructing right renal calculi.  Distended stomach and proximal duodenum, most likely due to reactive ileus.  Moderately enlarged prostate gland and seminal vesicles.    He has been passing fragments since the 07/20/2023.   He has passed a 6 mm x 4mm stone on the 21st of July.    KUB on 07/18/2018 revealed left mid ureteral stone stable in appearance. The distal left UVJ stone is not as well appreciated.  Nonobstructing right renal calculi.  He was scheduled for ESWL, but his stone could not be visualized due to bowel gas and the procedure was aborted.   He then passed a stone on the 31st of July.    RUS on 08/28/2018 revealed there is no hydronephrosis. Punctate nonobstructing stones are present on the right. There is probable cortical scarring on the right as well. No definite stones observed in the left kidney.  Stone analysis revealed 98% Uric acid stone and 2% calcium oxalate monohydrate.    At today's visit, he has no further flank pain.  He does state he has some urinary urgency, but he attributes that to drinking a lot of water.  Patient denies any gross hematuria, dysuria or suprapubic/flank pain.  Patient denies any fevers, chills, nausea or vomiting.     PMH: Past Medical History:  Diagnosis Date  . Headache   . Kidney stone   . PONV (postoperative nausea and vomiting)      Surgical History: Past Surgical History:  Procedure Laterality Date  . EXTRACORPOREAL SHOCK WAVE LITHOTRIPSY Left 07/18/2018   Procedure: EXTRACORPOREAL SHOCK WAVE LITHOTRIPSY (ESWL);  Surgeon: Vanna Scotland, MD;  Location: [facility] ORS;  Service: Urology;  Laterality: Left;  Marland Kitchen VARICOCELECTOMY      Home Medications:  Allergies as of 08/30/2018   No Known Allergies     Medication List        Accurate as of 08/30/18 10:38 AM. Always use your most recent med list.          tamsulosin 0.4 MG Caps capsule Commonly known as:  FLOMAX Take 1 capsule (0.4 mg total) by mouth every morning.       Allergies: No Known Allergies  Family History: Family History  Problem Relation Age of Onset  . Hypertension Mother   . Diabetes Mother   . Diabetes Father   . Hypertension Father     Social History:  reports that he has never smoked. He has never used smokeless tobacco. He reports that he drinks alcohol. He reports that he does not use drugs.  ROS: UROLOGY Frequent Urination?: No Hard to postpone urination?: No Burning/pain with urination?: No Get up at night to urinate?: No Leakage of urine?: No Urine stream starts and stops?: No Trouble starting stream?: No Do you have to strain to urinate?: No Blood in urine?: No Urinary tract infection?: No Sexually transmitted disease?: No Injury to kidneys or bladder?:  No Painful intercourse?: No Weak stream?: No Erection problems?: No Penile pain?: No  Gastrointestinal Nausea?: No Vomiting?: No Indigestion/heartburn?: No Diarrhea?: No Constipation?: No  Constitutional Fever: No Night sweats?: No Weight loss?: No Fatigue?: No  Skin Skin rash/lesions?: No Itching?: No  Eyes Blurred vision?: No Double vision?: No  Ears/Nose/Throat Sore throat?: No Sinus problems?: No  Hematologic/Lymphatic Swollen glands?: No Easy bruising?: No  Cardiovascular Leg swelling?: No Chest pain?: No  Respiratory Cough?:  No Shortness of breath?: No  Endocrine Excessive thirst?: No  Musculoskeletal Back pain?: No Joint pain?: No  Neurological Headaches?: No Dizziness?: No  Psychologic Depression?: No Anxiety?: No  Physical Exam: BP (!) 146/96 (BP Location: Right Arm, Patient Position: Sitting, Cuff Size: Normal)   Pulse 98   Ht 6\' 2"  (1.88 m)   Wt 187 lb (84.8 kg)   BMI 24.01 kg/m   Constitutional: Well nourished. Alert and oriented, No acute distress. HEENT:  AT, moist mucus membranes. Trachea midline, no masses. Cardiovascular: No clubbing, cyanosis, or edema. Respiratory: Normal respiratory effort, no increased work of breathing. GI: Abdomen is soft, non tender, non distended, no abdominal masses. Liver and spleen not palpable.  No hernias appreciated.  Stool sample for occult testing is not indicated.   GU: No CVA tenderness.  No bladder fullness or masses.  Patient with circumcised phallus.  Urethral meatus is patent.  No penile discharge. No penile lesions or rashes. Scrotum without lesions, cysts, rashes and/or edema.  Testicles are located scrotally bilaterally. No masses are appreciated in the testicles. Left and right epididymis are normal. Rectal: Patient with  normal sphincter tone. Anus and perineum without scarring or rashes. No rectal masses are appreciated. Prostate is approximately 55 grams, no nodules are appreciated. Seminal vesicles are normal. Skin: No rashes, bruises or suspicious lesions. Lymph: No cervical or inguinal adenopathy. Neurologic: Grossly intact, no focal deficits, moving all 4 extremities. Psychiatric: Normal mood and affect.  Laboratory Data: Lab Results  Component Value Date   WBC 12.6 (H) 07/15/2018   HGB 14.2 07/15/2018   HCT 43.3 07/15/2018   MCV 88 07/15/2018   PLT 211 07/15/2018    Lab Results  Component Value Date   CREATININE 1.74 (H) 07/15/2018    No results found for: PSA  No results found for: TESTOSTERONE  No results found  for: HGBA1C  No results found for: TSH  No results found for: CHOL, HDL, CHOLHDL, VLDL, LDLCALC  No results found for: AST No results found for: ALT No components found for: ALKALINEPHOPHATASE No components found for: BILIRUBINTOTAL  No results found for: ESTRADIOL  Urinalysis See HPI and Epic I have reviewed the labs.   Pertinent Imaging: CLINICAL DATA:  History of left-sided ureteral stone  EXAM: RENAL / URINARY TRACT ULTRASOUND COMPLETE  COMPARISON:  KUB of July 18, 2018 and abdominal and pelvic CT scan dated July 12, 2018 which demonstrated a proximal left ureteral stone.  FINDINGS: Right Kidney:  Length: 13.8 cm. There is cortical irregularity involving the mid to upper pole similar to that seen on the previous CT scan. There is no hydronephrosis. Punctate echogenic foci are present which may reflect tiny stones.  Left Kidney:  Length: 13.5 cm. There is no hydronephrosis. The renal cortical echotexture is normal. No definite stones are observed.  Bladder:  Bilateral ureteral jets are demonstrated in the normal appearing urinary bladder.  IMPRESSION: There is no hydronephrosis. Punctate nonobstructing stones are present on the right. There is probable cortical scarring on the right  as well. No definite stones observed in the left kidney.   Electronically Signed   By: David  [place] M.D.   On: 08/28/2018 15:44 I have independently reviewed the films.    Assessment & Plan:    1. Left ureteral stone Spontaneously passed  2. Left hydronephrosis Resolved   3. Microscopic hematuria UA today demonstrates 0-5 RBC's continue to monitor the patient's UA after the treatment/passage of the stone to ensure the hematuria has resolved if hematuria persists, we will pursue a hematuria workup with CT Urogram and cystoscopy if appropriate.  4. Right renal stones Will continue to monitor at this time Creatinine was normal at 0.94 Will need at least  yearly KUB's Advised to contact our office or seek treatment in the ED if becomes febrile or pain/ vomiting are difficult control in order to arrange for emergent/urgent intervention  Return for pending blood work results .  These notes generated with voice recognition software. I apologize for typographical errors.  Michiel Cowboy, PA-C  [facility] [address]  [address] [place], [place] [address] [phone]

## 2018-09-01 ENCOUNTER — Telehealth: Payer: Self-pay | Admitting: Urology

## 2018-09-01 NOTE — Telephone Encounter (Signed)
Please let Roger Russell know that his PSA and creatinine are normal.  He did still have some microscopic blood in his urine and therefore I would like to have it rechecked again in 1 month.  The microscopic blood in the urine may still be due to the healing process after he is passed his stones, but we want to make sure that the blood completely clears.

## 2018-09-02 ENCOUNTER — Telehealth: Payer: Self-pay

## 2018-09-02 NOTE — Telephone Encounter (Signed)
Pt informed, pt states he has passed two stones previously. One of them was about 2 days prior to his urine sample.

## 2018-09-02 NOTE — Telephone Encounter (Signed)
Patient notified and appointment is scheduled.

## 2018-09-02 NOTE — Telephone Encounter (Signed)
-----   Message from Harle Battiest, PA-C sent at 09/01/2018  5:42 PM EDT ----- Please let Iantha Fallen know that his PSA was normal at 1.09.

## 2018-09-24 ENCOUNTER — Telehealth: Payer: Self-pay | Admitting: Urology

## 2018-09-24 DIAGNOSIS — R3129 Other microscopic hematuria: Secondary | ICD-10-CM

## 2018-09-24 NOTE — Telephone Encounter (Signed)
Can you please put order on for pt to have urine done in [place] this Friday?  He said it was more convenient than coming to [place].

## 2018-09-24 NOTE — Addendum Note (Signed)
Addended by: Martha Clan on: 09/24/2018 11:27 AM   Modules accepted: Orders

## 2018-09-27 ENCOUNTER — Other Ambulatory Visit
Admission: RE | Admit: 2018-09-27 | Discharge: 2018-09-27 | Disposition: A | Discharge status: Home / Self Care | Payer: BLUE CROSS/BLUE SHIELD | Source: Ambulatory Visit | Attending: Urology | Admitting: Urology

## 2018-09-27 ENCOUNTER — Telehealth: Payer: Self-pay

## 2018-09-27 ENCOUNTER — Other Ambulatory Visit: Payer: Self-pay

## 2018-09-27 DIAGNOSIS — R3129 Other microscopic hematuria: Secondary | ICD-10-CM

## 2018-09-27 LAB — URINALYSIS, COMPLETE (UACMP) WITH MICROSCOPIC
Bilirubin Urine: NEGATIVE
Glucose, UA: NEGATIVE mg/dL
HGB URINE DIPSTICK: NEGATIVE
Ketones, ur: NEGATIVE mg/dL
Leukocytes, UA: NEGATIVE
Nitrite: NEGATIVE
Protein, ur: NEGATIVE mg/dL
RBC / HPF: NONE SEEN RBC/hpf (ref 0–5)
SPECIFIC GRAVITY, URINE: 1.02 (ref 1.005–1.030)
pH: 5 (ref 5.0–8.0)

## 2018-09-27 NOTE — Telephone Encounter (Signed)
Called pt no answer. Unable to leave detailed message as no DPR is on file.

## 2018-09-27 NOTE — Telephone Encounter (Signed)
-----   Message from Harle Battiest, PA-C sent at 09/27/2018 12:20 PM EDT ----- Please let Mr. Kaczmarek know that his urine was clear of blood.

## 2018-09-27 NOTE — Telephone Encounter (Signed)
Pt called office and I let him know there was no blood in urine.

## 2019-09-29 ENCOUNTER — Telehealth: Payer: Self-pay | Admitting: Urology

## 2019-09-29 NOTE — Telephone Encounter (Signed)
Please call Roger Russell and have him schedule a yearly follow up for his history of kidney stones.  He will need a KUB to evaluate his current stone situation.

## 2019-09-30 NOTE — Telephone Encounter (Signed)
LMOM for pt to call back to make appt w/KUB

## 2019-11-07 ENCOUNTER — Other Ambulatory Visit: Payer: Self-pay

## 2019-11-07 DIAGNOSIS — N201 Calculus of ureter: Secondary | ICD-10-CM

## 2019-11-09 NOTE — Progress Notes (Signed)
11/10/2019 12:52 PM   Roger Russell 06/14/1966 [id number]  Referring provider: Sharilyn SitesHeffington, Mark, MD [address] [facility] [place],  [place] [id number]  Chief Complaint  Patient presents with  . Nephrolithiasis    HPI: Patient is a 53 year old male with a history of nephrolithiasis who presents today for follow up.  CT Renal stone study on 07/12/2018 noted a 9 mm proximal left ureteral calculus causing mild to moderate left hydronephrosis.  4 mm distal left ureteral calculus at the ureterovesical junction, causing mild dilatation of the left ureter.   Small, nonobstructing right renal calculi.  Distended stomach and proximal duodenum, most likely due to reactive ileus.  Moderately enlarged prostate gland and seminal vesicles.    He has been passing fragments since the 19th of July.   He has passed a 6 mm x 4mm stone on the 21st of July.    KUB on 07/18/2018 revealed left mid ureteral stone stable in appearance. The distal left UVJ stone is not as well appreciated.  Nonobstructing right renal calculi.  He was scheduled for ESWL, but his stone could not be visualized due to bowel gas and the procedure was aborted.   He then passed a stone on the 31st of July.    RUS on 08/28/2018 revealed there is no hydronephrosis. Punctate nonobstructing stones are present on the right. There is probable cortical scarring on the right as well. No definite stones observed in the left kidney.  Stone analysis revealed 98% Uric acid stone and 2% calcium oxalate monohydrate.    Uric acid in 02/05/2019 5.1.    Today, he is not having symptoms of flank pain.  He has been having a rust colored sediment in the toilet 2 to 3 times in the last year.  UA positive for trace ketones and 6-10 RBC's.    KUB No definite radiopaque renal calculi are identified.   He is also interested in a vasectomy, but he has questions regarding its impact on erectile dysfunction.     PMH: Past Medical  History:  Diagnosis Date  . Headache   . Kidney stone   . PONV (postoperative nausea and vomiting)     Surgical History: Past Surgical History:  Procedure Laterality Date  . EXTRACORPOREAL SHOCK WAVE LITHOTRIPSY Left 07/18/2018   Procedure: EXTRACORPOREAL SHOCK WAVE LITHOTRIPSY (ESWL);  Surgeon: Vanna ScotlandBrandon, Ashley, MD;  Location: [facility] ORS;  Service: Urology;  Laterality: Left;  Marland Kitchen. VARICOCELECTOMY      Home Medications:  Allergies as of 11/10/2019      Reactions   Atorvastatin    Metoprolol Tinitus      Medication List       Accurate as of November 10, 2019 11:59 PM. If you have any questions, ask your nurse or doctor.        losartan 25 MG tablet Commonly known as: COZAAR Take 25 mg by mouth daily.   rosuvastatin 5 MG tablet Commonly known as: CRESTOR Take by mouth.   tamsulosin 0.4 MG Caps capsule Commonly known as: FLOMAX Take 1 capsule (0.4 mg total) by mouth every morning.       Allergies:  Allergies  Allergen Reactions  . Atorvastatin   . Metoprolol Tinitus    Family History: Family History  Problem Relation Age of Onset  . Hypertension Mother   . Diabetes Mother   . Diabetes Father   . Hypertension Father     Social History:  reports that he has never smoked. He has never  used smokeless tobacco. He reports current alcohol use. He reports that he does not use drugs.  ROS: UROLOGY Frequent Urination?: No Hard to postpone urination?: No Burning/pain with urination?: No Get up at night to urinate?: No Leakage of urine?: No Urine stream starts and stops?: No Trouble starting stream?: No Do you have to strain to urinate?: No Blood in urine?: No Urinary tract infection?: No Sexually transmitted disease?: No Injury to kidneys or bladder?: No Painful intercourse?: No Weak stream?: No Erection problems?: No Penile pain?: No  Gastrointestinal Nausea?: No Vomiting?: No Indigestion/heartburn?: No Diarrhea?: No Constipation?: No   Constitutional Fever: No Night sweats?: No Weight loss?: No Fatigue?: No  Skin Skin rash/lesions?: No Itching?: No  Eyes Blurred vision?: No Double vision?: No  Ears/Nose/Throat Sore throat?: No Sinus problems?: No  Hematologic/Lymphatic Swollen glands?: No Easy bruising?: No  Cardiovascular Leg swelling?: No Chest pain?: No  Respiratory Cough?: No Shortness of breath?: No  Endocrine Excessive thirst?: No  Musculoskeletal Back pain?: No Joint pain?: No  Neurological Headaches?: No Dizziness?: No  Psychologic Depression?: No Anxiety?: No  Physical Exam: BP (!) 149/89   Pulse (!) 110   Ht 6\' 2"  (1.88 m)   Wt 183 lb (83 kg)   BMI 23.50 kg/m   Constitutional:  Well nourished. Alert and oriented, No acute distress. HEENT: [place] AT, moist mucus membranes.  Trachea midline, no masses. Cardiovascular: No clubbing, cyanosis, or edema. Respiratory: Normal respiratory effort, no increased work of breathing. Neurologic: Grossly intact, no focal deficits, moving all 4 extremities. Psychiatric: Normal mood and affect.  Laboratory Data: Lab Results  Component Value Date   WBC 12.6 (H) 07/15/2018   HGB 14.2 07/15/2018   HCT 43.3 07/15/2018   MCV 88 07/15/2018   PLT 211 07/15/2018    Lab Results  Component Value Date   CREATININE 0.94 08/30/2018    No results found for: PSA  No results found for: TESTOSTERONE  No results found for: HGBA1C  No results found for: TSH  No results found for: CHOL, HDL, CHOLHDL, VLDL, LDLCALC  No results found for: AST No results found for: ALT No components found for: ALKALINEPHOPHATASE No components found for: BILIRUBINTOTAL  No results found for: ESTRADIOL  Urinalysis Component     Latest Ref Rng & Units 11/10/2019  Color, Urine     YELLOW YELLOW  Appearance     CLEAR CLEAR  Specific Gravity, Urine     1.005 - 1.030 1.025  pH     5.0 - 8.0 5.0  Glucose, UA     NEGATIVE mg/dL NEGATIVE  Hgb urine  dipstick     NEGATIVE NEGATIVE  Bilirubin Urine     NEGATIVE NEGATIVE  Ketones, ur     NEGATIVE mg/dL TRACE (A)  Protein     NEGATIVE mg/dL NEGATIVE  Nitrite     NEGATIVE NEGATIVE  Leukocytes,Ua     NEGATIVE NEGATIVE  Squamous Epithelial / LPF     0 - 5 NONE SEEN  WBC, UA     0 - 5 WBC/hpf NONE SEEN  RBC / HPF     0 - 5 RBC/hpf 6-10  Bacteria, UA     NONE SEEN NONE SEEN   I have reviewed the labs.   Pertinent Imaging: CLINICAL DATA:  Nephrolithiasis.  EXAM: ABDOMEN - 1 VIEW  COMPARISON:  Renal ultrasound 08/28/2018, abdominal radiographs 07/18/2018, CT renal stone protocol 07/12/2018.  FINDINGS: Two small calcific densities project over the left upper quadrant which appear  to correspond with a previously demonstrated calcified granulomas within the left lower lobe and spleen. No definite radiopaque renal calculus is identified. Moderate stool burden within the colon. Lumbar levocurvature. No acute bony abnormality.  IMPRESSION: No definite radiopaque renal calculi are identified.  Two small calcific densities which project over the left upper quadrant appear to correspond with previously demonstrated calcified granulomas within the left lower lobe and left spleen.   Electronically Signed   By: Kellie Simmering DO   On: 11/10/2019 17:11     I have independently reviewed the films and do not appreciate any renal calculi, but he has a history of uric acid stones and they have a tendency of being radio lucent.  Assessment & Plan:    1. Microscopic hematuria UA today demonstrates 6-10 RBC's, I explained that microscopic hematuria may be the result of stones but that also genitourinary cancers may present in this fashion. Discussed with patient that we could proceed with CT urogram and cystoscopy as the CT urogram would both evaluate for uric acid stones and genitourinary cancers.  Roger Russell is concerned about the cost of these tests.  We then discussed the  AUA guidelines regarding the work-up for microscopic hematuria for which he would follow in the intermediate risk group which would recommend renal ultrasound and cystoscopy and while these tests would be less cost prohibitive they may not identify ureteral stones.  He stated he would like to research the cost of these procedures with his insurance company and will contact us with his decision.  2. Right renal stones Not present on today's KUB  3. ED Discussed with Roger Russell the male anatomy in regards to how the vasectomy is performed and how an erection is achieved.  He would like to postpone vasectomy at this time until his microscopic hematuria is worked up  Return for patient to call back .  These notes generated with voice recognition software. I apologize for typographical errors.  Zara Council, PA-C  [place]  [place], [place] [address] [phone]  I spent 45 with this patient in a face to face conversation of which greater than 50% was spent in counseling and coordination of care with the patient regarding his microscopic hematuria, erectile dysfunction and vasectomy.

## 2019-11-10 ENCOUNTER — Ambulatory Visit: Payer: BLUE CROSS/BLUE SHIELD | Admitting: Urology

## 2019-11-10 ENCOUNTER — Other Ambulatory Visit: Payer: Self-pay | Admitting: Family Medicine

## 2019-11-10 ENCOUNTER — Ambulatory Visit
Admission: RE | Admit: 2019-11-10 | Discharge: 2019-11-10 | Disposition: A | Discharge status: Home / Self Care | Payer: BLUE CROSS/BLUE SHIELD | Source: Ambulatory Visit | Attending: Urology | Admitting: Urology

## 2019-11-10 ENCOUNTER — Other Ambulatory Visit
Admission: RE | Admit: 2019-11-10 | Discharge: 2019-11-10 | Disposition: A | Discharge status: Home / Self Care | Payer: BLUE CROSS/BLUE SHIELD | Source: Home / Self Care | Attending: Urology | Admitting: Urology

## 2019-11-10 ENCOUNTER — Other Ambulatory Visit: Payer: Self-pay

## 2019-11-10 ENCOUNTER — Encounter: Payer: Self-pay | Admitting: Urology

## 2019-11-10 ENCOUNTER — Ambulatory Visit
Admission: RE | Admit: 2019-11-10 | Discharge: 2019-11-10 | Disposition: A | Discharge status: Home / Self Care | Payer: BLUE CROSS/BLUE SHIELD | Attending: Urology | Admitting: Urology

## 2019-11-10 VITALS — BP 149/89 | HR 110 | Ht 74.0 in | Wt 183.0 lb

## 2019-11-10 DIAGNOSIS — N529 Male erectile dysfunction, unspecified: Secondary | ICD-10-CM

## 2019-11-10 DIAGNOSIS — Z87442 Personal history of urinary calculi: Secondary | ICD-10-CM

## 2019-11-10 DIAGNOSIS — N201 Calculus of ureter: Secondary | ICD-10-CM

## 2019-11-10 DIAGNOSIS — R3129 Other microscopic hematuria: Secondary | ICD-10-CM | POA: Diagnosis not present

## 2019-11-10 LAB — URINALYSIS, COMPLETE (UACMP) WITH MICROSCOPIC
Bacteria, UA: NONE SEEN
Bilirubin Urine: NEGATIVE
Glucose, UA: NEGATIVE mg/dL
Hgb urine dipstick: NEGATIVE
Leukocytes,Ua: NEGATIVE
Nitrite: NEGATIVE
Protein, ur: NEGATIVE mg/dL
Specific Gravity, Urine: 1.025 (ref 1.005–1.030)
Squamous Epithelial / LPF: NONE SEEN (ref 0–5)
WBC, UA: NONE SEEN WBC/hpf (ref 0–5)
pH: 5 (ref 5.0–8.0)

## 2019-11-18 ENCOUNTER — Telehealth: Payer: Self-pay | Admitting: Urology

## 2019-11-18 NOTE — Telephone Encounter (Signed)
That would be fine, but I would like the UA done at our lab in [place].

## 2019-11-18 NOTE — Telephone Encounter (Signed)
Pt called and wants to know if he can drop off another UA before he decides if he want s to do a renal US or CYSTO. Please advise.

## 2019-11-19 NOTE — Telephone Encounter (Signed)
LMOM for patient to return call.

## 2019-11-24 ENCOUNTER — Other Ambulatory Visit: Payer: Self-pay

## 2019-11-24 DIAGNOSIS — R3129 Other microscopic hematuria: Secondary | ICD-10-CM

## 2019-11-24 NOTE — Telephone Encounter (Signed)
Patient notified an lab appointment has been made.

## 2019-11-25 ENCOUNTER — Other Ambulatory Visit: Payer: BLUE CROSS/BLUE SHIELD

## 2019-11-25 ENCOUNTER — Other Ambulatory Visit: Payer: Self-pay

## 2019-11-25 DIAGNOSIS — R3129 Other microscopic hematuria: Secondary | ICD-10-CM

## 2019-11-25 LAB — URINALYSIS, COMPLETE
Bilirubin, UA: NEGATIVE
Glucose, UA: NEGATIVE
Ketones, UA: NEGATIVE
Leukocytes,UA: NEGATIVE
Nitrite, UA: NEGATIVE
Protein,UA: NEGATIVE
RBC, UA: NEGATIVE
Specific Gravity, UA: 1.02 (ref 1.005–1.030)
Urobilinogen, Ur: 0.2 mg/dL (ref 0.2–1.0)
pH, UA: 6 (ref 5.0–7.5)

## 2019-11-25 LAB — MICROSCOPIC EXAMINATION
Bacteria, UA: NONE SEEN
Epithelial Cells (non renal): NONE SEEN /hpf (ref 0–10)
RBC: NONE SEEN /hpf (ref 0–2)

## 2019-11-26 ENCOUNTER — Telehealth: Payer: Self-pay | Admitting: Urology

## 2019-11-26 NOTE — Telephone Encounter (Signed)
-----   Message from Nori Riis, PA-C sent at 11/26/2019  8:52 AM EST ----- I have left a message for Roger Russell to call back.  Please let him know that the recent UA the dropped off did not show any microscopic blood, but that my recommendation still stands to undergo the ultrasound of his kidneys and the cystoscopy as the guidelines are based off seeing microscopic blood on just one UA.  Our goal here is to identify any cancers that may be in the early stages so that we have a better chance of treatment.

## 2019-11-26 NOTE — Telephone Encounter (Signed)
Pt called and states that she is returning Shannon's call.

## 2019-11-26 NOTE — Telephone Encounter (Signed)
I meant He is returning Shannon's call.Marland Kitchen

## 2019-11-26 NOTE — Telephone Encounter (Signed)
Spoke to patient and informed him of the recommendation. He did not want to schedule an appointment at this time.

## 2019-12-23 ENCOUNTER — Encounter: Payer: Self-pay | Admitting: Urology

## 2020-04-05 IMAGING — CT CT RENAL STONE PROTOCOL
1 of 2 series · 15 of 32 positions shown, 19 images · non-contrast
Comparison: None.

CLINICAL DATA: Left flank pain since noon today.  Hematuria.

EXAM:
CT ABDOMEN AND PELVIS WITHOUT CONTRAST
TECHNIQUE: Multidetector CT imaging of the abdomen and pelvis was performed
following the standard protocol without IV contrast.

[Series 2: axial st · axial · 0.69mm/px · z∈[-975,-555]mm · 15 of 92 slices shown, 19 images]
[im 4/92  soft-tissue]
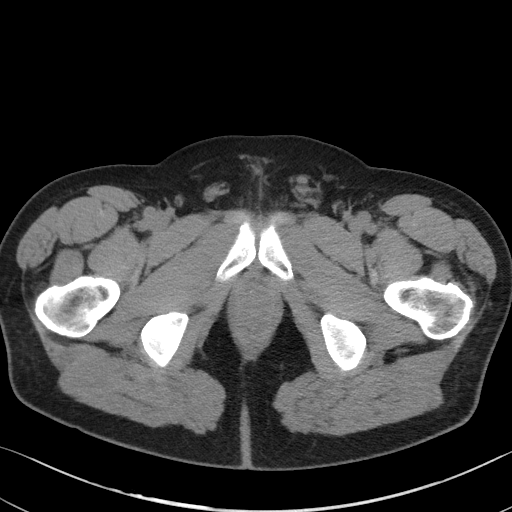
[im 4/92  bone]
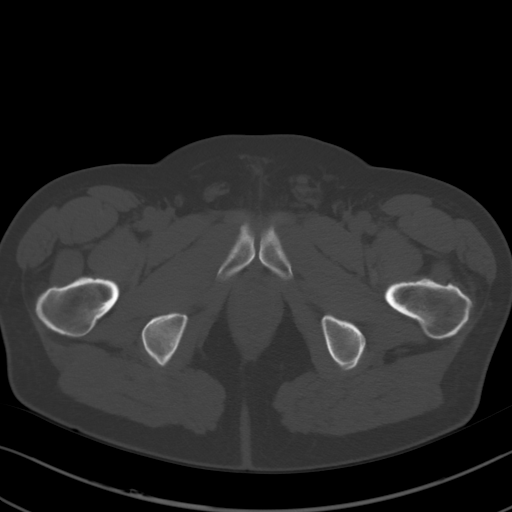
[im 12/92  soft-tissue]
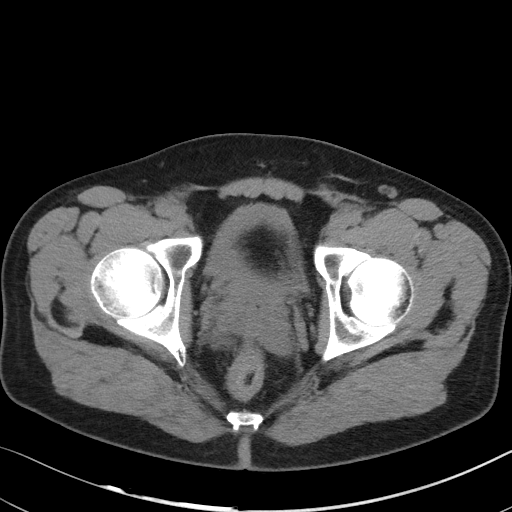
[im 19/92  soft-tissue]
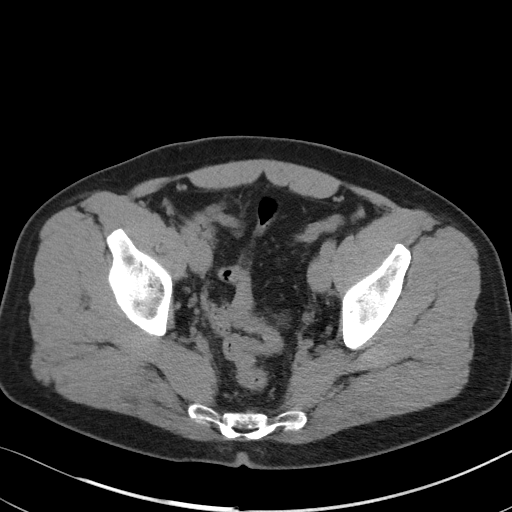
[im 27/92  soft-tissue]
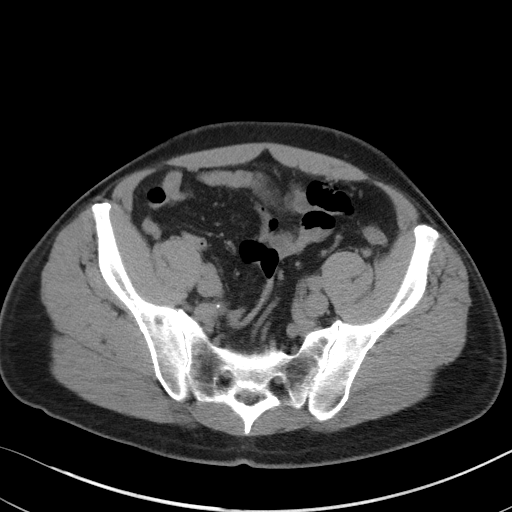
[im 31/92  soft-tissue]
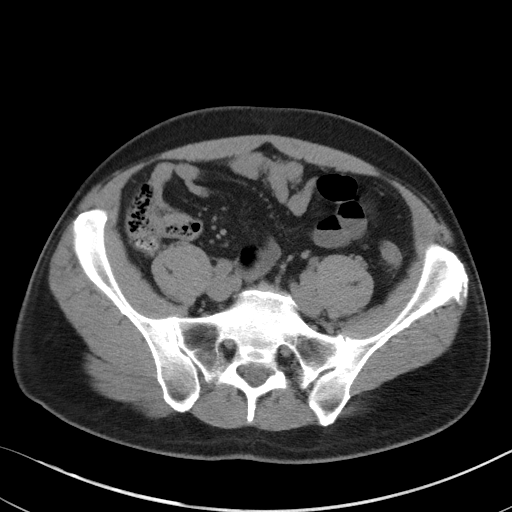
[im 38/92  soft-tissue]
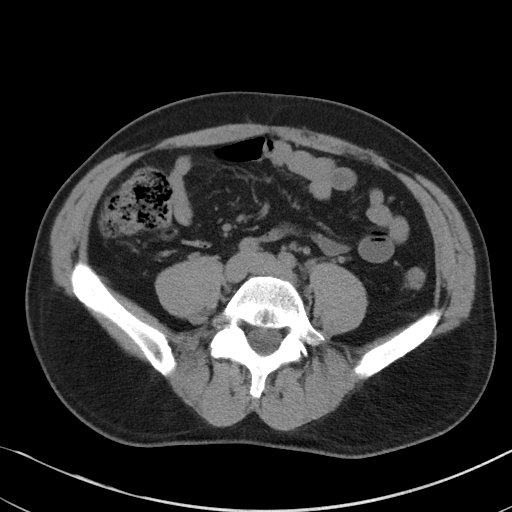
[im 46/92  soft-tissue]
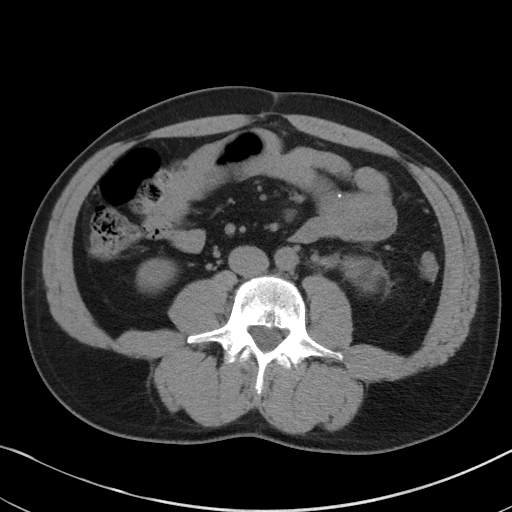
[im 54/92  soft-tissue]
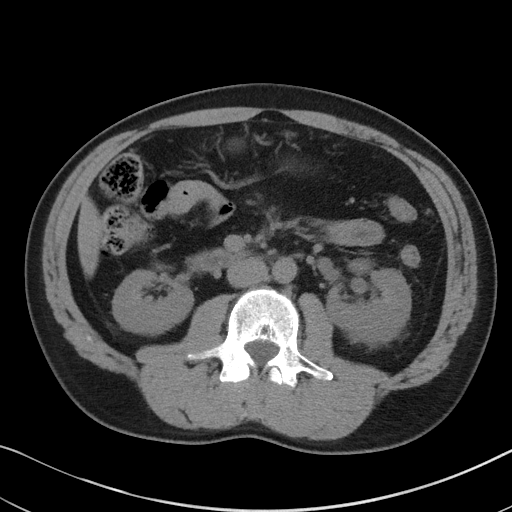
[im 61/92  soft-tissue]
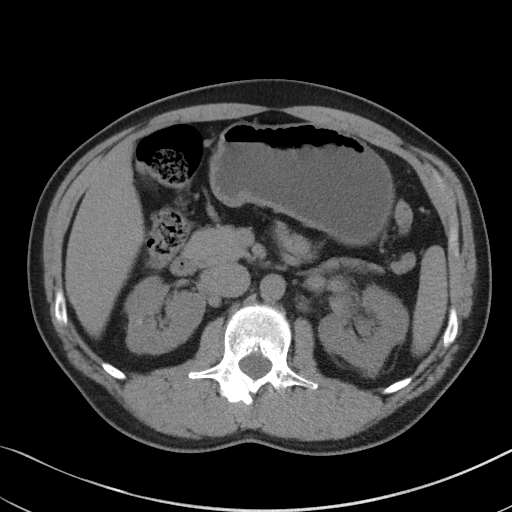
[im 61/92  bone]
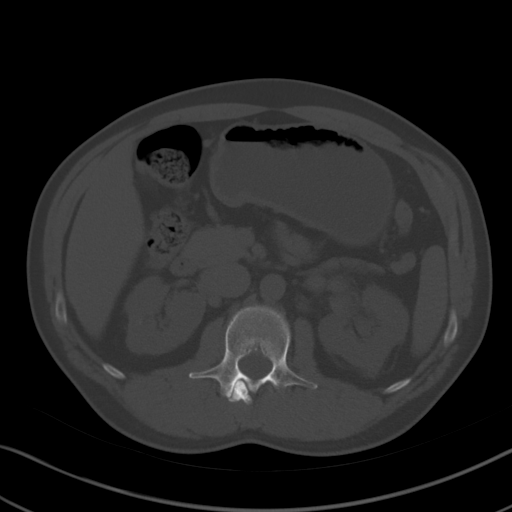
[im 65/92  soft-tissue]
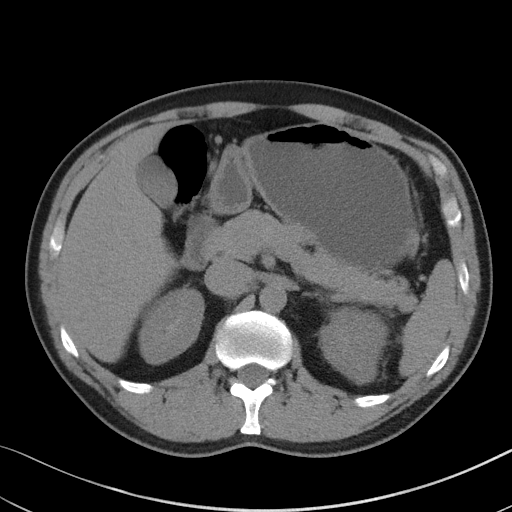
[im 73/92  soft-tissue]
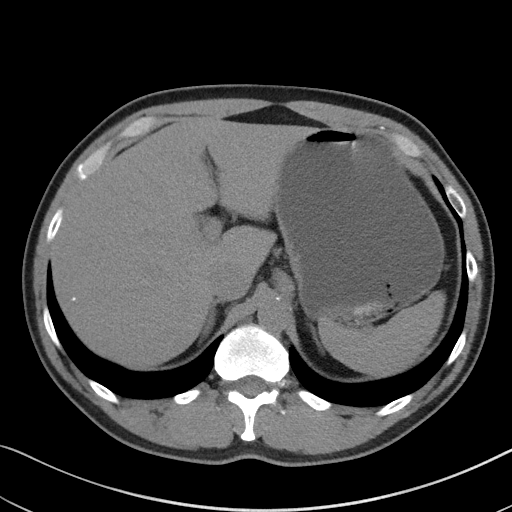
[im 76/92  lung]
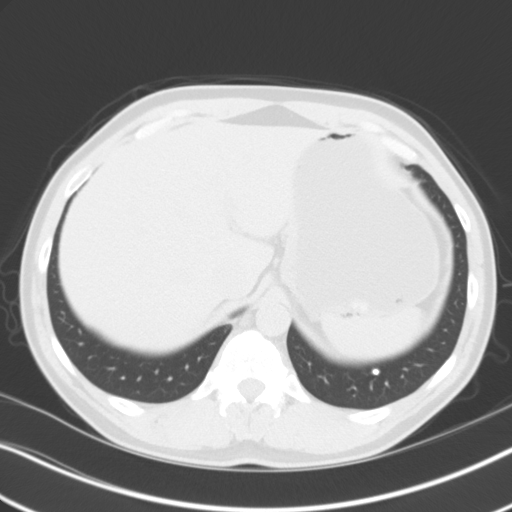
[im 80/92  soft-tissue]
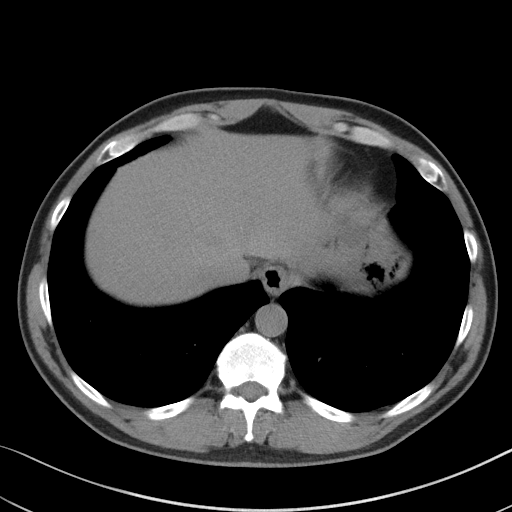
[im 80/92  lung]
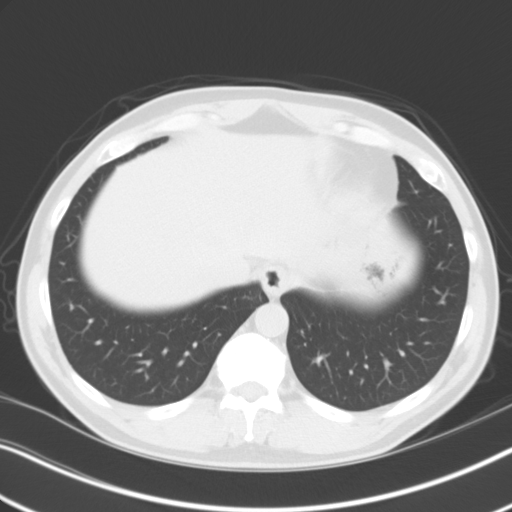
[im 84/92  lung]
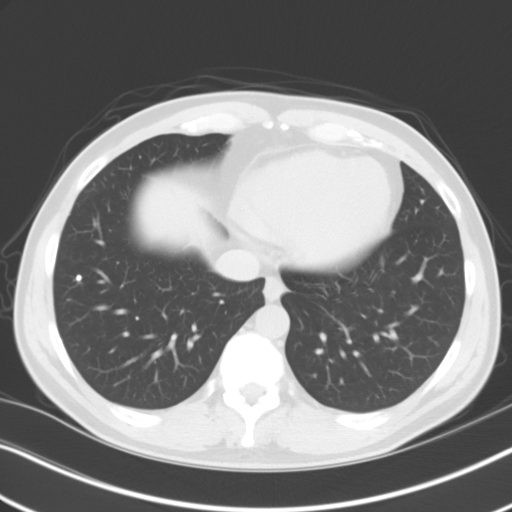
[im 88/92  soft-tissue]
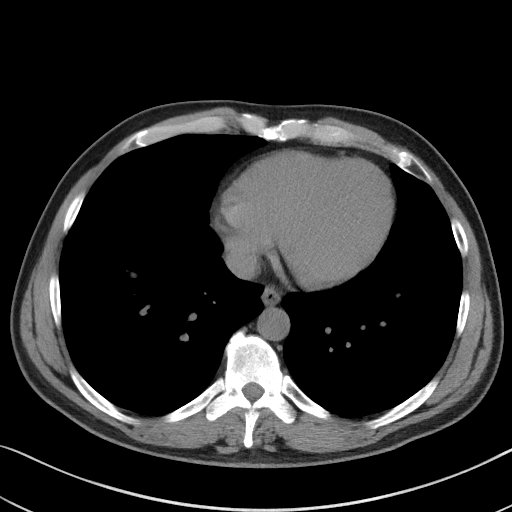
[im 88/92  lung]
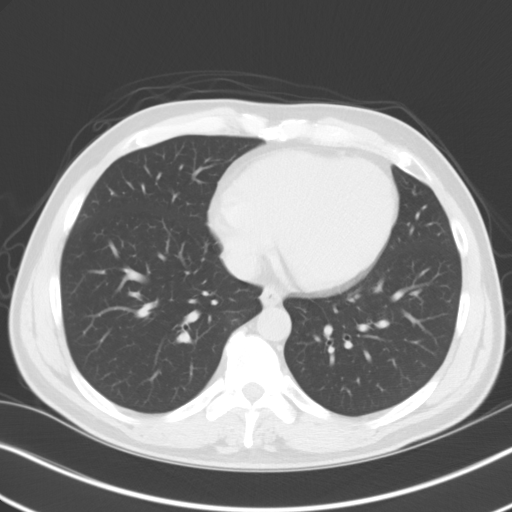

[15 of 32 positions shown; findings below may reference images not displayed]

FINDINGS: Lower chest: Calcified granuloma at the left lung base.

Hepatobiliary: Small calcified granuloma in the right lobe of the
liver. Probable sludge in the gallbladder.

Pancreas: Unremarkable. No pancreatic ductal dilatation or
surrounding inflammatory changes.

Spleen: Normal in size, containing a calcified granuloma.

Adrenals/Urinary Tract: 5 mm transverse diameter calculus in the
proximal left ureter, just below the ureteropelvic junction. This
measures 9 mm in length. Associated mild to moderate dilatation of
the left renal collecting system and mild left perinephric soft
tissue stranding.

There is also a 4 mm calculus at the left ureterovesical junction
with mild dilatation of the left ureter.

Several small mid and lower pole right renal calculi. The largest
measures 5 mm in maximum diameter. No right ureteral calculi.

Normal appearing adrenal glands.

Stomach/Bowel: Distended stomach containing fluid and gas. The
proximal 2nd portion of the duodenum is also distended with fluid.
No obstructing mass seen. The remainder of the small bowel is normal
in caliber, as is the colon. Normal appearing appendix.

Vascular/Lymphatic: Minimal atheromatous aortic calcification. No
enlarged lymph nodes.

Reproductive: Moderately enlarged prostate gland and seminal
vesicles.

Other: Tiny umbilical hernia containing fat small left inguinal
hernia containing fat.

Musculoskeletal: Lower thoracic and minimal lumbar spine
degenerative changes. Mild scoliosis.
IMPRESSION: 1. 9 mm proximal left ureteral calculus causing mild to moderate
left hydronephrosis.
2. 4 mm distal left ureteral calculus at the ureterovesical
junction, causing mild dilatation of the left ureter.
3. Small, nonobstructing right renal calculi.
4. Distended stomach and proximal duodenum, most likely due to
reactive ileus.

## 2020-04-11 IMAGING — CR DG ABDOMEN 1V
1 series · 1 of 1 positions shown · non-contrast
Comparison: 07/12/2018

CLINICAL DATA: Follow-up left ureteral stone

EXAM:
ABDOMEN - 1 VIEW

[dg abd 1 view]
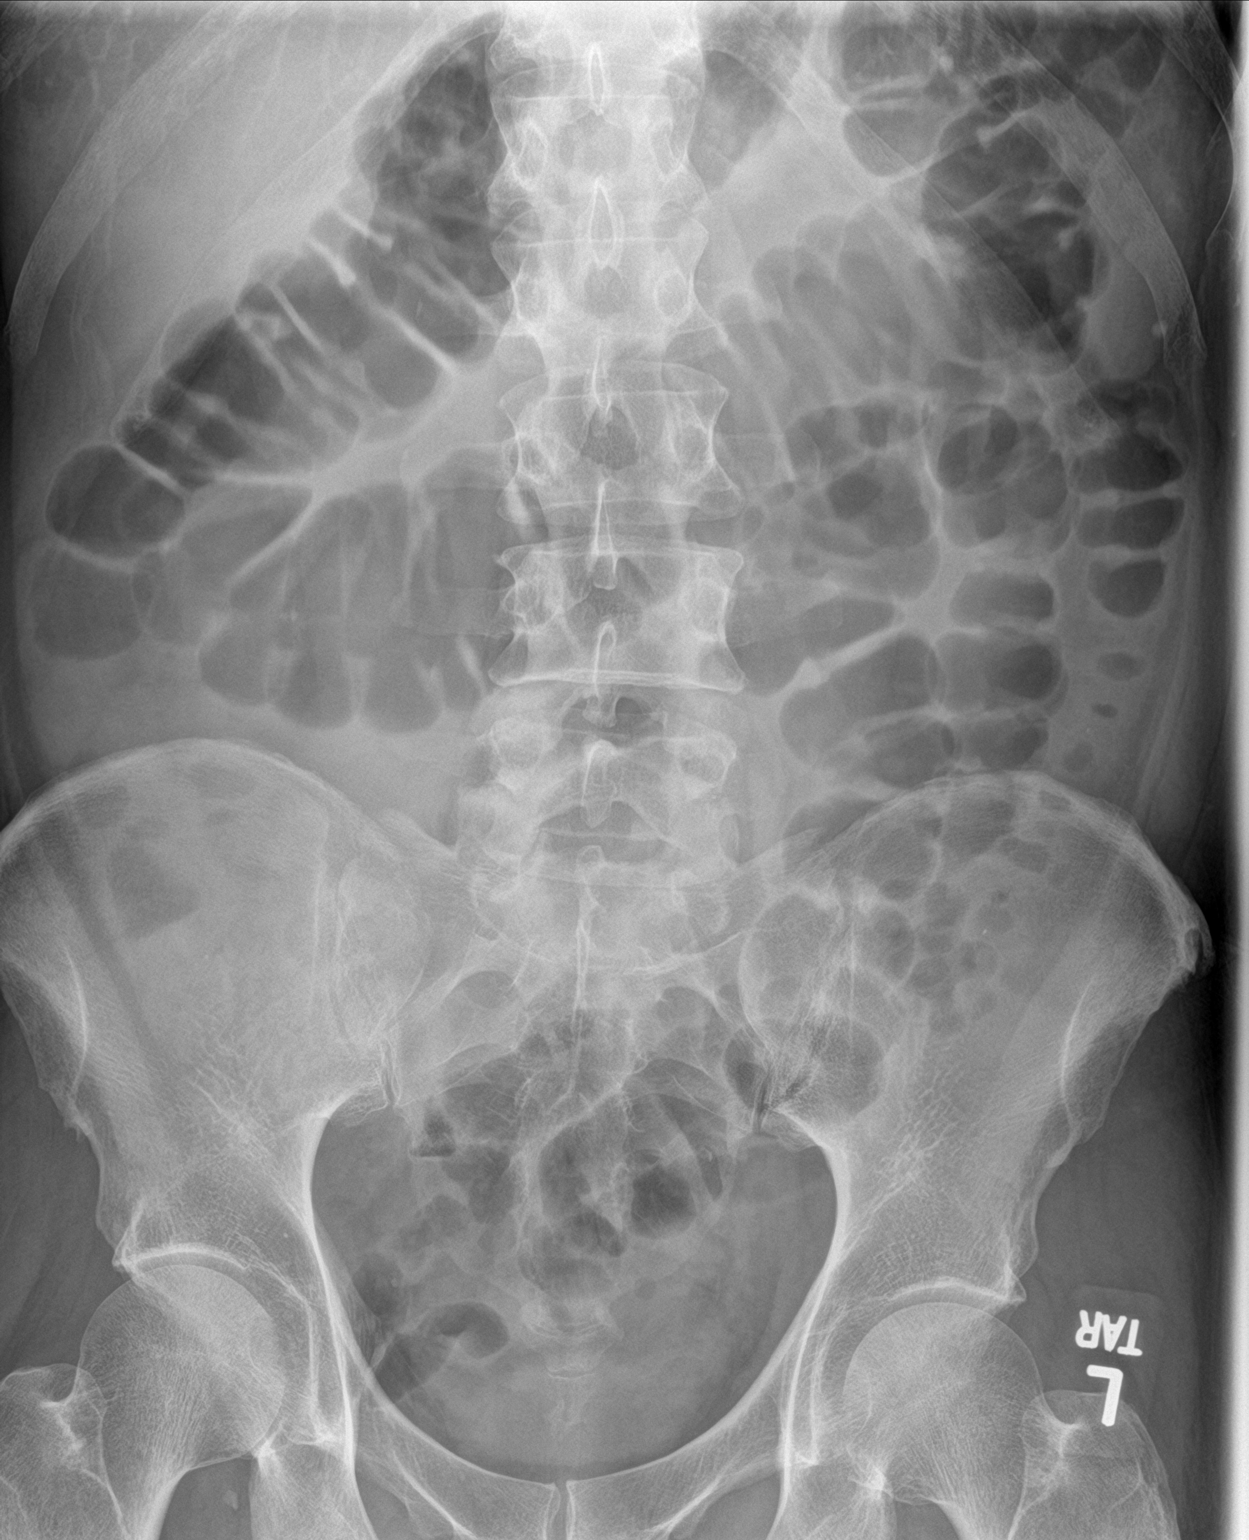

[1 of 1 positions shown; findings below may reference images not displayed]

FINDINGS: Scattered large and small bowel gas is noted. The known left
ureteral stone is again identified and stable at the L4 transverse
process level. The stone identified in left UVJ is not as well
appreciated. A vague density in the left hemipelvis is noted which
could correspond to this stone. Nonobstructing right renal calculi
are again seen.
IMPRESSION: Left mid ureteral stone stable in appearance. The distal left UVJ
stone is not as well appreciated

Nonobstructing right renal calculi.

## 2020-06-21 NOTE — Progress Notes (Signed)
Certified Letter
